# Patient Record
Sex: Male | Born: 1965 | Race: White | Hispanic: No | Marital: Married | Smoking: Former smoker
Health system: Southern US, Community
[De-identification: ages and names within clinical notes are randomized; demographics above are authoritative.]

## PROBLEM LIST (undated history)

## (undated) HISTORY — PX: KNEE SURGERY: SHX244

## (undated) HISTORY — PX: SHOULDER SURGERY: SHX246

---

## 2013-08-11 ENCOUNTER — Ambulatory Visit: Payer: Self-pay

## 2013-08-18 ENCOUNTER — Ambulatory Visit: Payer: Self-pay

## 2013-08-25 ENCOUNTER — Ambulatory Visit: Payer: Self-pay

## 2013-10-12 ENCOUNTER — Encounter (HOSPITAL_COMMUNITY): Payer: Self-pay | Admitting: Emergency Medicine

## 2013-10-12 ENCOUNTER — Emergency Department (HOSPITAL_COMMUNITY)
Admission: EM | Admit: 2013-10-12 | Discharge: 2013-10-12 | Disposition: A | Discharge status: Home / Self Care | Payer: 59 | Attending: Emergency Medicine | Admitting: Emergency Medicine

## 2013-10-12 ENCOUNTER — Emergency Department (HOSPITAL_COMMUNITY): Payer: 59

## 2013-10-12 DIAGNOSIS — Z79899 Other long term (current) drug therapy: Secondary | ICD-10-CM | POA: Insufficient documentation

## 2013-10-12 DIAGNOSIS — N201 Calculus of ureter: Secondary | ICD-10-CM | POA: Insufficient documentation

## 2013-10-12 DIAGNOSIS — R112 Nausea with vomiting, unspecified: Secondary | ICD-10-CM | POA: Insufficient documentation

## 2013-10-12 DIAGNOSIS — Z87891 Personal history of nicotine dependence: Secondary | ICD-10-CM | POA: Insufficient documentation

## 2013-10-12 LAB — URINALYSIS, ROUTINE W REFLEX MICROSCOPIC
Bilirubin Urine: NEGATIVE
Glucose, UA: NEGATIVE mg/dL
KETONES UR: NEGATIVE mg/dL
Leukocytes, UA: NEGATIVE
Nitrite: NEGATIVE
PROTEIN: NEGATIVE mg/dL
Specific Gravity, Urine: 1.028 (ref 1.005–1.030)
UROBILINOGEN UA: 0.2 mg/dL (ref 0.0–1.0)
pH: 5 (ref 5.0–8.0)

## 2013-10-12 LAB — CBC WITH DIFFERENTIAL/PLATELET
Basophils Absolute: 0 10*3/uL (ref 0.0–0.1)
Basophils Relative: 0 % (ref 0–1)
EOS PCT: 0 % (ref 0–5)
Eosinophils Absolute: 0 10*3/uL (ref 0.0–0.7)
HEMATOCRIT: 40.4 % (ref 39.0–52.0)
HEMOGLOBIN: 14.3 g/dL (ref 13.0–17.0)
Lymphocytes Relative: 10 % — ABNORMAL LOW (ref 12–46)
Lymphs Abs: 0.9 10*3/uL (ref 0.7–4.0)
MCH: 31.5 pg (ref 26.0–34.0)
MCHC: 35.4 g/dL (ref 30.0–36.0)
MCV: 89 fL (ref 78.0–100.0)
Monocytes Absolute: 0.5 10*3/uL (ref 0.1–1.0)
Monocytes Relative: 6 % (ref 3–12)
Neutro Abs: 7.7 10*3/uL (ref 1.7–7.7)
Neutrophils Relative %: 84 % — ABNORMAL HIGH (ref 43–77)
Platelets: 140 10*3/uL — ABNORMAL LOW (ref 150–400)
RBC: 4.54 MIL/uL (ref 4.22–5.81)
RDW: 12.9 % (ref 11.5–15.5)
WBC: 9.2 10*3/uL (ref 4.0–10.5)

## 2013-10-12 LAB — URINE MICROSCOPIC-ADD ON

## 2013-10-12 LAB — COMPREHENSIVE METABOLIC PANEL
ALT: 27 U/L (ref 0–53)
AST: 19 U/L (ref 0–37)
Albumin: 3.7 g/dL (ref 3.5–5.2)
Alkaline Phosphatase: 80 U/L (ref 39–117)
BILIRUBIN TOTAL: 0.4 mg/dL (ref 0.3–1.2)
BUN: 21 mg/dL (ref 6–23)
CALCIUM: 8.9 mg/dL (ref 8.4–10.5)
CHLORIDE: 101 meq/L (ref 96–112)
CO2: 20 meq/L (ref 19–32)
Creatinine, Ser: 1.19 mg/dL (ref 0.50–1.35)
GFR calc Af Amer: 81 mL/min — ABNORMAL LOW (ref >90)
GFR, EST NON AFRICAN AMERICAN: 70 mL/min — AB (ref >90)
Glucose, Bld: 167 mg/dL — ABNORMAL HIGH (ref 70–99)
Potassium: 3.7 mEq/L (ref 3.7–5.3)
SODIUM: 136 meq/L — AB (ref 137–147)
Total Protein: 7 g/dL (ref 6.0–8.3)

## 2013-10-12 LAB — LIPASE, BLOOD: Lipase: 40 U/L (ref 11–59)

## 2013-10-12 MED ORDER — HYDROCODONE-ACETAMINOPHEN 5-325 MG PO TABS: 1.0000 | ORAL_TABLET | ORAL | Status: AC | PRN

## 2013-10-12 MED ORDER — KETOROLAC TROMETHAMINE 30 MG/ML IJ SOLN
30.0000 mg | Freq: Once | INTRAMUSCULAR | Status: AC
  Administered 2013-10-12: 30 mg via INTRAVENOUS
  Filled 2013-10-12: qty 1

## 2013-10-12 MED ORDER — HYDROMORPHONE HCL PF 1 MG/ML IJ SOLN
1.0000 mg | Freq: Once | INTRAMUSCULAR | Status: AC
  Administered 2013-10-12: 1 mg via INTRAVENOUS
  Filled 2013-10-12: qty 1

## 2013-10-12 MED ORDER — ONDANSETRON HCL 4 MG PO TABS: 4.0000 mg | ORAL_TABLET | Freq: Four times a day (QID) | ORAL | Status: AC

## 2013-10-12 MED ORDER — TAMSULOSIN HCL 0.4 MG PO CAPS: 0.4000 mg | ORAL_CAPSULE | Freq: Every day | ORAL | Status: AC

## 2013-10-12 MED ORDER — ONDANSETRON HCL 4 MG/2ML IJ SOLN
4.0000 mg | Freq: Once | INTRAMUSCULAR | Status: AC
  Administered 2013-10-12: 4 mg via INTRAVENOUS
  Filled 2013-10-12: qty 2

## 2013-10-12 NOTE — ED Notes (Signed)
Pt escorted to discharge window. Pt verbalized understanding discharge instructions. In no acute distress.  

## 2013-10-12 NOTE — ED Provider Notes (Signed)
Medical screening examination/treatment/procedure(s) were performed by non-physician practitioner and as supervising physician I was immediately available for consultation/collaboration.    Bryan Mackielga [name], MD 10/12/13 [phone]

## 2013-10-12 NOTE — ED Notes (Signed)
Pt alert and oriented x4. Respirations even and unlabored, bilateral symmetrical rise and fall of chest. Skin warm and dry. In no acute distress. Denies needs.   

## 2013-10-12 NOTE — ED Provider Notes (Signed)
CSN: [id number]     Arrival date & time 10/12/13  0559 History   First MD Initiated Contact with Patient 10/12/13 [phone]     Chief Complaint  Patient presents with  . Flank Pain   (Consider location/radiation/quality/duration/timing/severity/associated sxs/prior Treatment) HPI Patient reports periumbilical and lower abdominal pain with small amount of radiation to his back that began abuptly at 3:30am, waking him from sleep.  He thought he might be constipated and took a laxative and then pepto bismol without any relief.  Has had a kidney stone before that felt somewhat similar but was more in his back than this.  Has vomited once, contents of his stomach.  Denies fevers, diarrhea, urinary symptoms, testicular pain or swelling.  No hx abdominal surgeries. Has never seen a urologist.   History reviewed. No pertinent past medical history. History reviewed. No pertinent past surgical history. History reviewed. No pertinent family history. History  Substance Use Topics  . Smoking status: Former Games developermoker  . Smokeless tobacco: Not on file  . Alcohol Use: Yes    Review of Systems  Constitutional: Negative for fever.  Respiratory: Negative for cough and shortness of breath.   Cardiovascular: Negative for chest pain.  Gastrointestinal: Positive for nausea, vomiting and abdominal pain. Negative for diarrhea.  Genitourinary: Negative for dysuria, urgency and frequency.  All other systems reviewed and are negative.    Allergies  Review of patient's allergies indicates no known allergies.  Home Medications   Current Outpatient Rx  Name  Route  Sig  Dispense  Refill  . acetaminophen (TYLENOL) 500 MG tablet   Oral   Take 500-1,000 mg by mouth every 6 (six) hours as needed for mild pain or headache.         . bisacodyl (DULCOLAX) 5 MG EC tablet   Oral   Take 5 mg by mouth daily as needed for moderate constipation.         Marland Kitchen. HYDROcodone-acetaminophen (NORCO/VICODIN) 5-325 MG per tablet    Oral   Take 1 tablet by mouth every 6 (six) hours as needed for moderate pain.         Marland Kitchen. HYDROcodone-acetaminophen (NORCO/VICODIN) 5-325 MG per tablet   Oral   Take 1 tablet by mouth every 4 (four) hours as needed.   15 tablet   0   . ondansetron (ZOFRAN) 4 MG tablet   Oral   Take 1 tablet (4 mg total) by mouth every 6 (six) hours.   12 tablet   0   . tamsulosin (FLOMAX) 0.4 MG CAPS capsule   Oral   Take 1 capsule (0.4 mg total) by mouth daily.   30 capsule   0    BP 165/90  Pulse 65  Temp(Src) 97.9 F (36.6 C) (Other (Comment))  Resp 24  SpO2 100% Physical Exam  Nursing note and vitals reviewed. Constitutional: He appears well-developed and well-nourished. No distress.  HENT:  Head: Normocephalic and atraumatic.  Neck: Neck supple.  Cardiovascular: Normal rate and regular rhythm.   Pulmonary/Chest: Effort normal and breath sounds normal. No respiratory distress. He has no wheezes. He has no rales.  Abdominal: Soft. He exhibits no distension and no mass. There is generalized tenderness. There is no rebound and no guarding.  Neurological: He is alert. He exhibits normal muscle tone.  Skin: He is not diaphoretic.    ED Course  Procedures (including critical care time) Labs Review Labs Reviewed  CBC WITH DIFFERENTIAL - Abnormal; Notable for the following:  Platelets 140 (*)    Neutrophils Relative % 84 (*)    Lymphocytes Relative 10 (*)    All other components within normal limits  COMPREHENSIVE METABOLIC PANEL - Abnormal; Notable for the following:    Sodium 136 (*)    Glucose, Bld 167 (*)    GFR calc non Af Amer 70 (*)    GFR calc Af Amer 81 (*)    All other components within normal limits  URINALYSIS, ROUTINE W REFLEX MICROSCOPIC - Abnormal; Notable for the following:    Hgb urine dipstick SMALL (*)    All other components within normal limits  URINE CULTURE  LIPASE, BLOOD  URINE MICROSCOPIC-ADD ON   Imaging Review Ct Abdomen Pelvis Wo  Contrast  10/12/2013   CLINICAL DATA:  Low back pain. Pain radiating into the penis. Flank pain.  EXAM: CT ABDOMEN AND PELVIS WITHOUT CONTRAST  TECHNIQUE: Multidetector CT imaging of the abdomen and pelvis was performed following the standard protocol without intravenous contrast.  COMPARISON:  None.  FINDINGS: Lung Bases: Dependent atelectasis.  Liver: Unenhanced CT was performed per clinician order. Lack of IV contrast limits sensitivity and specificity, especially for evaluation of abdominal/pelvic solid viscera. Grossly normal.  Spleen:  Normal.  Gallbladder:  Normal.  Common bile duct:  Normal.  Pancreas:  Normal.  Adrenal glands:  Normal.  Kidneys: Left kidney appears normal. No calculi. The left ureter is normal. There is mild right hydroureteronephrosis extending into the anatomic pelvis. At the right UVJ, there is a 2 mm calculus in the intramural portion of the distal right ureter. There is a punctate right upper pole residual collecting system calculus.  Stomach:  Grossly normal.  Small bowel:  Grossly normal.  Colon: Normal appendix. Remainder of the colon is within normal limits aside from colonic diverticulosis. No diverticulitis.  Pelvic Genitourinary:  Collapsed urinary bladder.  IMPRESSION:  1. Mild right hydroureteronephrosis with 2 mm distal right UVJ stone in the intramural right ureter. 2. Punctate nonobstructing residual collecting system calculus in the right kidney.   Electronically Signed   By: Andreas Newport M.D.   On: 10/12/2013 06:55    EKG Interpretation   None      7:54 AM Pt reports he thinks he passed the stone and is feeling much better, pain free currently.    MDM   1. Right ureteral stone    Pt with abrupt onset of abdominal pain, N/V, found to have right ureteral stone at UVJ - Likely passed while in ED as patient became completely pain free.  Labs unremarkable.  No signs of UTI.  D/C home with norco, zofran, flomax.  Urology follow up.  Urine culture pending.   Discussed result, findings, treatment, and follow up  with patient.  Pt given return precautions.  Pt verbalizes understanding and agrees with plan.        [place], PA-C 10/12/13 [phone]

## 2013-10-12 NOTE — Discharge Instructions (Signed)
Read the information below.  Use the prescribed medication as directed.  Please discuss all new medications with your pharmacist.  Do not take additional tylenol while taking the prescribed pain medication to avoid overdose.  You may return to the Emergency Department at any time for worsening condition or any new symptoms that concern you.  If you develop high fevers, worsening abdominal pain, uncontrolled vomiting, or are unable to tolerate fluids by mouth, return to the ER for a recheck.     Diet for Kidney Stones Kidney stones are small, hard masses that form inside your kidneys. They are made up of salts and minerals and often form when high levels build up in the urine. The minerals can then start to build up, crystalize, and stick together to form stones. There are several different types of kidney stones. The following types of stones may be influenced by dietary factors:   Calcium Oxalate Stones. An oxalate is a salt found in certain foods. Within the body, calcium can combine with oxalates to form calcium oxalate stones, which can be excreted in the urine in high amounts. This is the most common type of kidney stone.  Calcium Phosphate Stones. These stones may occur when the pH of the urine becomes too high, or less acidic, from too much calcium being excreted in the urine. The pH is a measure of how acidic or basic a substance is.  Uric Acid Stones. This type of stone occurs when the pH of the urine becomes too low, or very acidic, because substances called purines build up in the urine. Purines are found in animal proteins. When the urine is highly concentrated with acid, uric acid kidney stones can form.  Other risk factors for kidney stones include genetics, environment, and being overweight. Your caregiver may ask you to follow specific diet guidelines based on the type of stone you have to lessen the chances of your body making more kidney stones.  GENERAL GUIDELINES FOR ALL TYPES OF  STONES  Drink plenty of fluid. Drink 12 16 cups of fluid a day, drinking mainly water.This is the most important thing you can do to prevent the formation of future kidney stones.  Maintain a healthy weight. Your caregiver or dietitian can help you determine what a healthy weight is for you. If you are overweight, weight loss may help prevent the formation of future kidney stones.  Eat a diet adequate in animal protein. Too much animal protein can contribute to the formation of stones. Your dietitian can help you determine how much protein you should be eating. Avoid low carbohydrate, high protein diets.  Follow a balanced eating approach. The DASH diet, which stands for "Dietary Approaches to Stop Hypertension," is an effective meal plan for reducing stone formation. This diet is high in fruits, vegetables, dairy, and whole grains and low in animal protein. Ask your caregiver or dietitian for information about the DASH diet. ADDITIONAL DIET GUIDELINES FOR CALCIUM STONES Avoid foods high in salt. This includes table salt, salt seasonings, MSG, soy sauce, cured and processed meats, salted crackers and snack foods, fast food, and canned soups and foods. Ask your caregiver or dietitian for information about reducing sodium in your diet or following the low sodium diet.  Ensure adequate calcium intake. Use the following table for calcium guidelines:  Men 43 years old and younger  1000 mg/day.  Men 62 years old and older  1500 mg/day.  Women 30 50 years old  1000 mg/day.  Women 50  years and older  1500 mg/day. Your dietitian can help you determine if you are getting enough calcium in your diet. Foods that are high in calcium include dairy products, broccoli, cheese, yogurt, and pudding. If you need to take a calcium supplement, take it only in the form of calcium citrate.  Avoid foods high in oxalate. Be sure that any supplements you take do not contain more than 500 mg of vitamin C. Vitamin C is  converted into oxalate in the body. You do not need to avoid fruits and vegetables high in vitamin C.   Grains: High-fiber or bran cereal, whole-wheat bread, grits, barley, buckwheat, amaranth, pretzels, and fruitcake.  Vegetables: Dried beans, wax beans, dark leafy greens, eggplant, leeks, okra, parsley, rutabaga, tomato paste, watercress, zucchini, and escarole.  Fruit: Dried apricots, red currants, figs, kiwi, and rhubarb.  Meat and Meat Substitutes: Soybeans and foods made from soy (soyburger, miso), dried beans, peanut butter.  Milk: Chocolate milk mixes and soymilk.  Fats and Oils: Nuts (peanuts, almonds, pecans, cashews, hazelnuts) and nut butters, sesame seeds, and tDahini paste.  Condiments/Miscellaneous: Chocolate, carob, marmalade, poppy seeds, instant iced tea, and juice from high-oxalate fruits.  Document Released: 12/15/2010 Document Revised: 02/19/2012 Document Reviewed: 02/04/2012 [facility] Patient Information 2014 [place], [place].  Ureteral Colic (Kidney Stones) Ureteral colic is the result of a condition when kidney stones form inside the kidney. Once kidney stones are formed they may move into the tube that connects the kidney with the bladder (ureter). If this occurs, this condition may cause pain (colic) in the ureter.  CAUSES  Pain is caused by stone movement in the ureter and the obstruction caused by the stone. SYMPTOMS  The pain comes and goes as the ureter contracts around the stone. The pain is usually intense, sharp, and stabbing in character. The location of the pain may move as the stone moves through the ureter. When the stone is near the kidney the pain is usually located in the back and radiates to the belly (abdomen). When the stone is ready to pass into the bladder the pain is often located in the lower abdomen on the side the stone is located. At this location, the symptoms may mimic those of a urinary tract infection with urinary frequency. Once the stone  is located here it often passes into the bladder and the pain disappears completely. TREATMENT   Your caregiver will provide you with medicine for pain relief.  You may require specialized follow-up X-rays.  The absence of pain does not always mean that the stone has passed. It may have just stopped moving. If the urine remains completely obstructed, it can cause loss of kidney function or even complete destruction of the involved kidney. It is your responsibility and in your interest that X-rays and follow-ups as suggested by your caregiver are completed. Relief of pain without passage of the stone can be associated with severe damage to the kidney, including loss of kidney function on that side.  If your stone does not pass on its own, additional measures may be taken by your caregiver to ensure its removal. HOME CARE INSTRUCTIONS   Increase your fluid intake. Water is the preferred fluid since juices containing vitamin C may acidify the urine making it less likely for certain stones (uric acid stones) to pass.  Strain all urine. A strainer will be provided. Keep all particulate matter or stones for your caregiver to inspect.  Take your pain medicine as directed.  Make a follow-up appointment  with your caregiver as directed.  Remember that the goal is passage of your stone. The absence of pain does not mean the stone is gone. Follow your caregiver's instructions.  Only take over-the-counter or prescription medicines for pain, discomfort, or fever as directed by your caregiver. SEEK MEDICAL CARE IF:   Pain cannot be controlled with the prescribed medicine.  You have a fever.  Pain continues for longer than your caregiver advises it should.  There is a change in the pain, and you develop chest discomfort or constant abdominal pain.  You feel faint or pass out. MAKE SURE YOU:   Understand these instructions.  Will watch your condition.  Will get help right away if you are not  doing well or get worse. Document Released: 05/30/2005 Document Revised: 12/15/2012 Document Reviewed: 02/14/2011 [facility] Patient Information 2014 [place], [place].

## 2013-10-12 NOTE — ED Notes (Signed)
Pt arrived to the ED with a complaint of abdominal pain.  Pt states she was awoke from sleep two hours from this writing with lower abdominal pain.  Pt states he is also having pain in  The head of his penis.

## 2013-10-13 ENCOUNTER — Telehealth (HOSPITAL_COMMUNITY): Payer: Self-pay

## 2013-10-13 LAB — URINE CULTURE
COLONY COUNT: NO GROWTH
CULTURE: NO GROWTH

## 2014-12-24 IMAGING — CT CT ABD-PELV W/O CM
1 series · 14 of 22 positions shown, 19 images · non-contrast
Comparison: None.

CLINICAL DATA: Low back pain. Pain radiating into the penis. Flank
pain.

EXAM:
CT ABDOMEN AND PELVIS WITHOUT CONTRAST
TECHNIQUE: Multidetector CT imaging of the abdomen and pelvis was performed
following the standard protocol without intravenous contrast.

[Series 3: lung · axial · 0.74mm/px · z∈[+1618,+1712]mm · 14 of 22 slices shown, 19 images]
[im 2/22  soft-tissue]
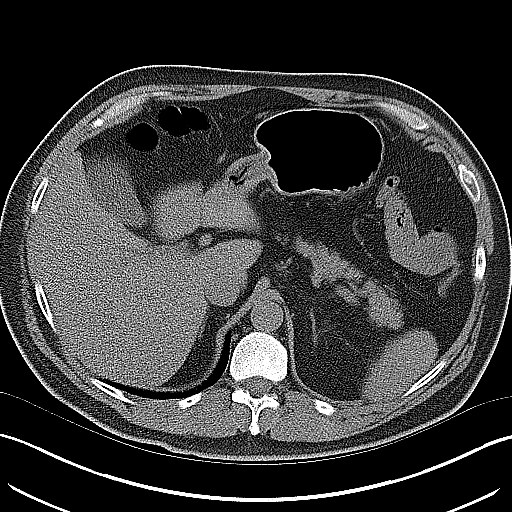
[im 2/22  bone]
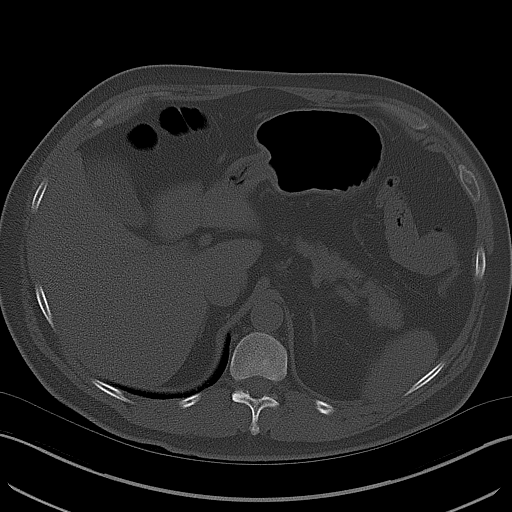
[im 4/22  soft-tissue]
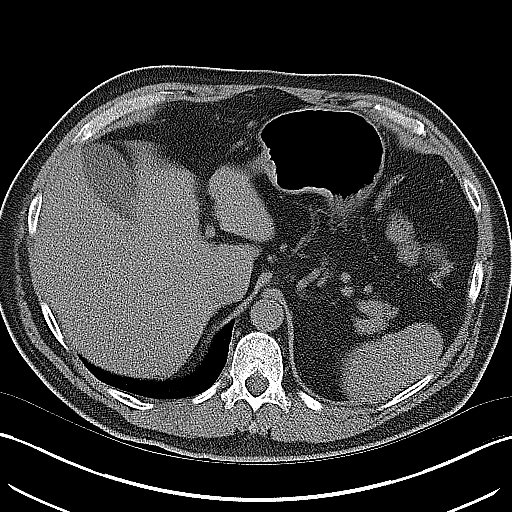
[im 5/22  soft-tissue]
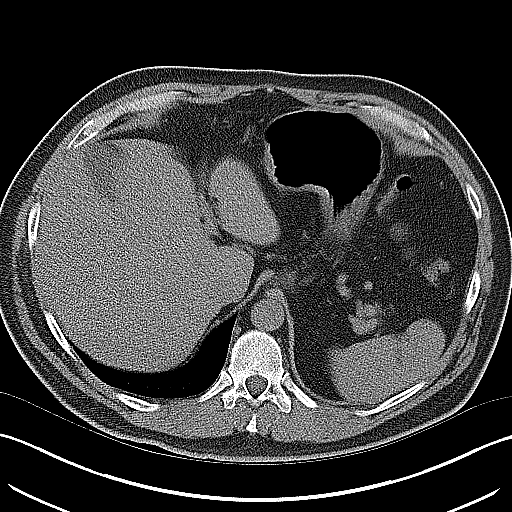
[im 7/22  soft-tissue]
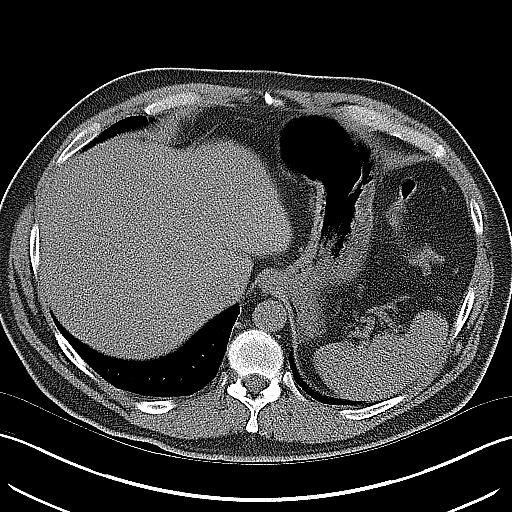
[im 8/22  soft-tissue]
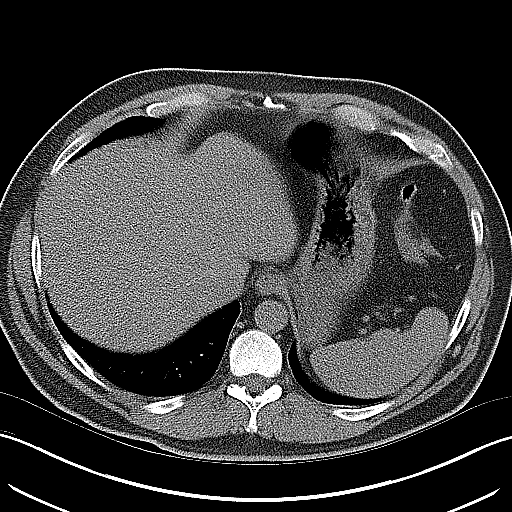
[im 10/22  soft-tissue]
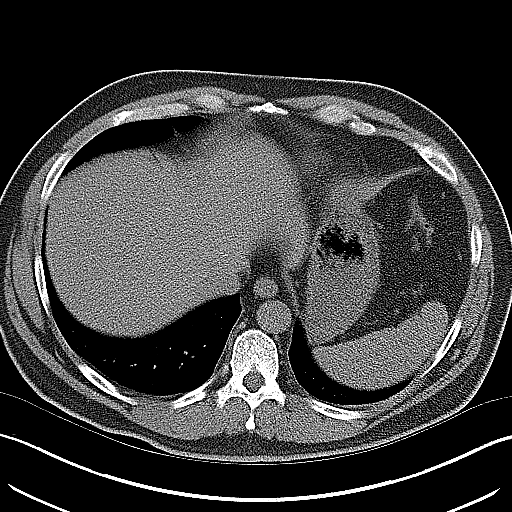
[im 12/22  soft-tissue]
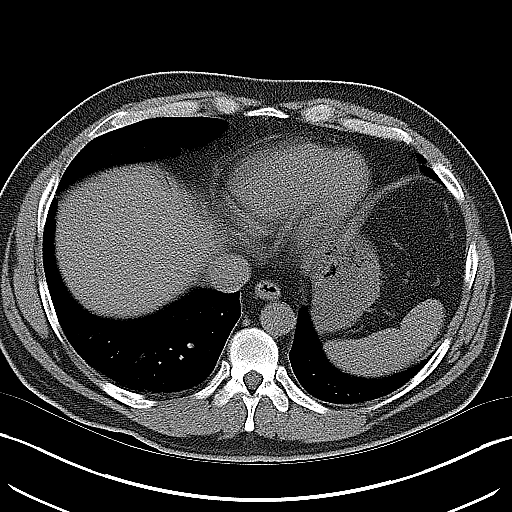
[im 13/22  soft-tissue]
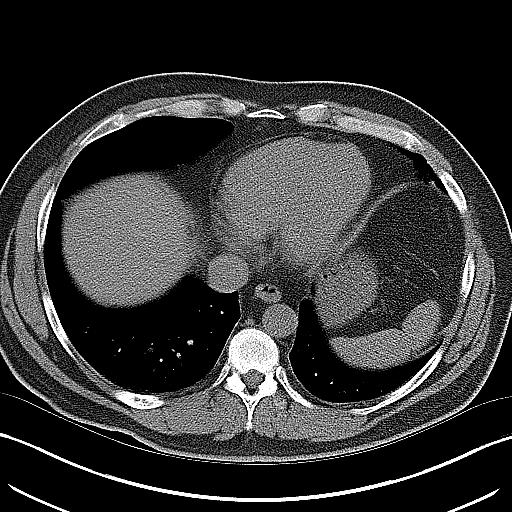
[im 15/22  soft-tissue]
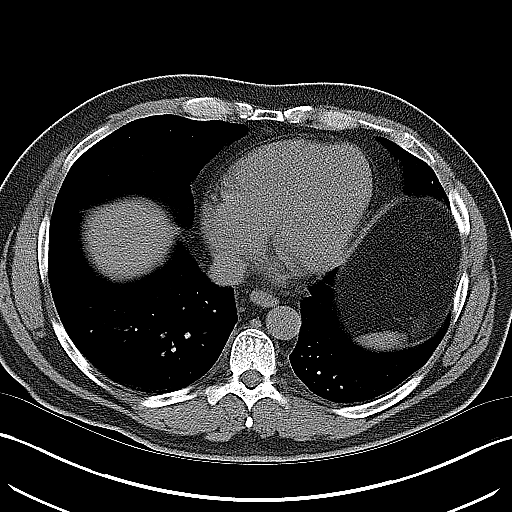
[im 15/22  bone]
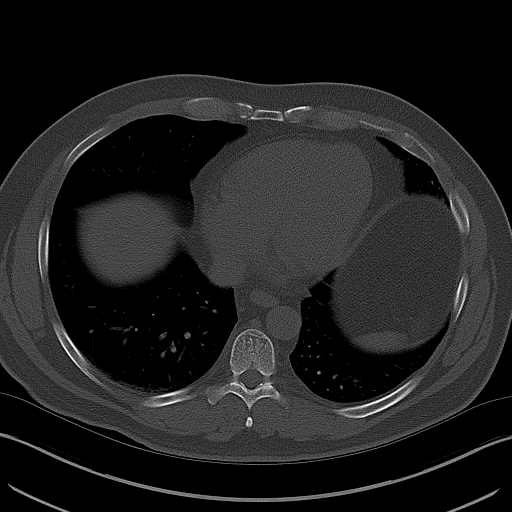
[im 16/22  soft-tissue]
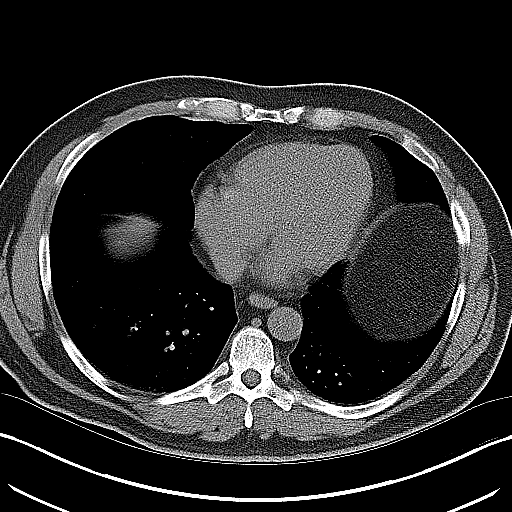
[im 18/22  soft-tissue]
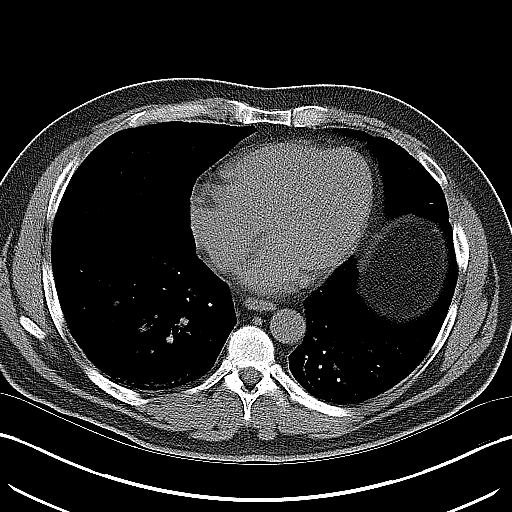
[im 18/22  lung]
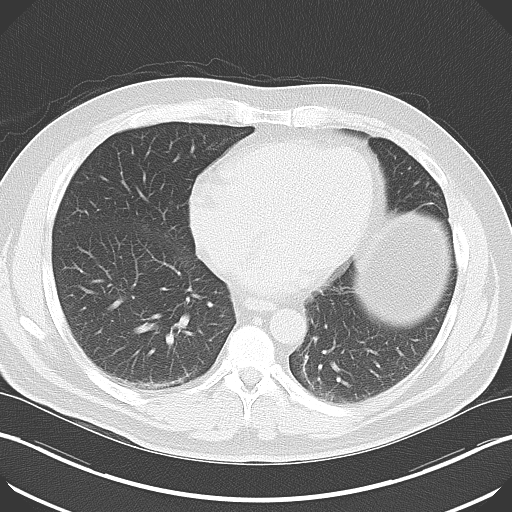
[im 19/22  soft-tissue]
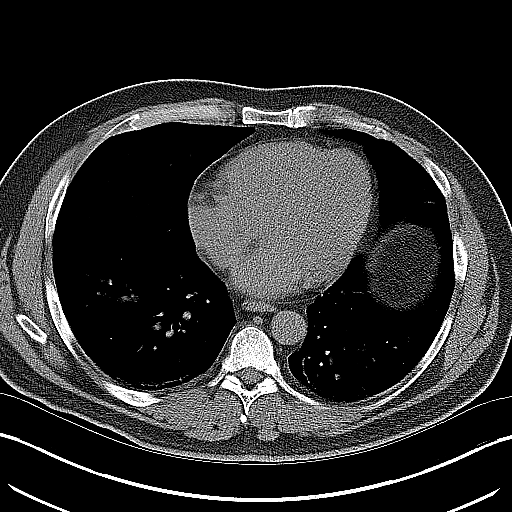
[im 19/22  lung]
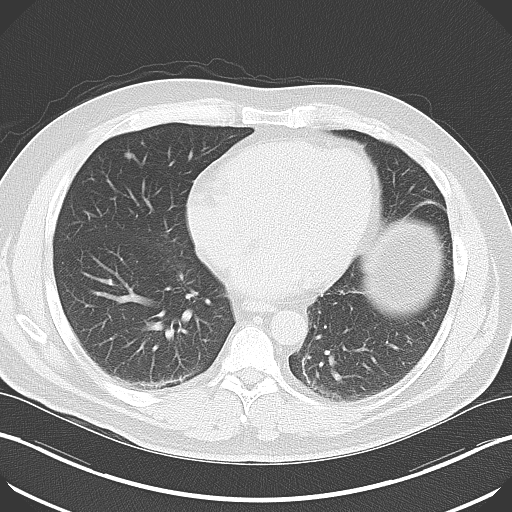
[im 20/22  lung]
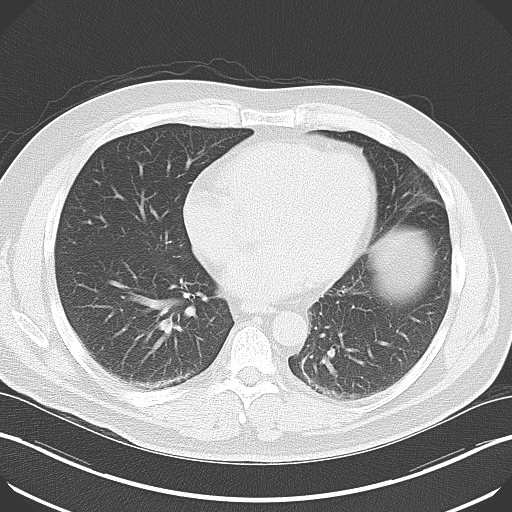
[im 21/22  soft-tissue]
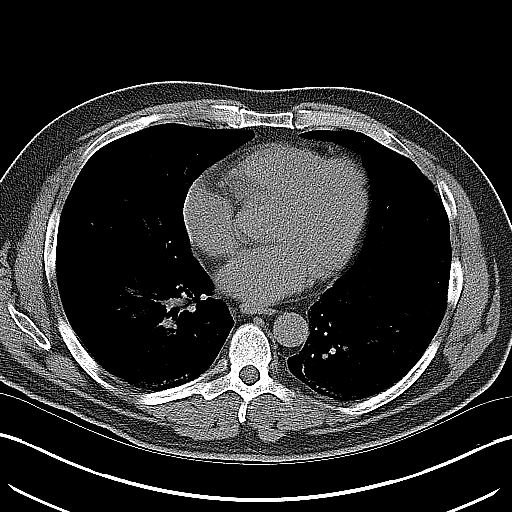
[im 21/22  lung]
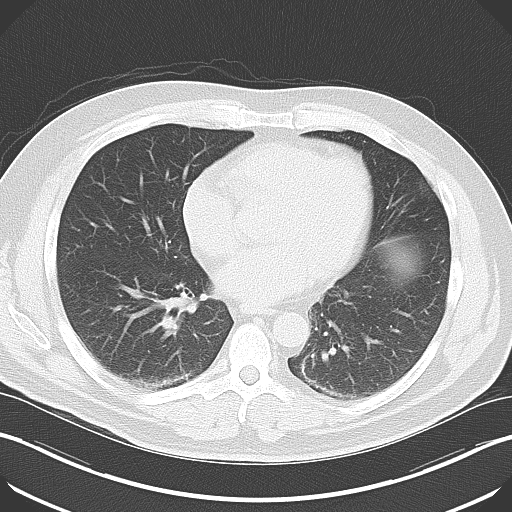

[14 of 22 positions shown; findings below may reference images not displayed]

FINDINGS: Lung Bases: Dependent atelectasis.

Liver: Unenhanced CT was performed per clinician order. Lack of IV
contrast limits sensitivity and specificity, especially for
evaluation of abdominal/pelvic solid viscera. Grossly normal.

Spleen:  Normal.

Gallbladder:  Normal.

Common bile duct:  Normal.

Pancreas:  Normal.

Adrenal glands:  Normal.

Kidneys: Left kidney appears normal. No calculi. The left ureter is
normal. There is mild right hydroureteronephrosis extending into the
anatomic pelvis. At the right UVJ, there is a 2 mm calculus in the
intramural portion of the distal right ureter. There is a punctate
right upper pole residual collecting system calculus.

Stomach:  Grossly normal.

Small bowel:  Grossly normal.

Colon: Normal appendix. Remainder of the colon is within normal
limits aside from colonic diverticulosis. No diverticulitis.

Pelvic Genitourinary:  Collapsed urinary bladder.
IMPRESSION: 1. Mild right hydroureteronephrosis with 2 mm distal right UVJ stone
in the intramural right ureter.
2. Punctate nonobstructing residual collecting system calculus in
the right kidney.

## 2018-06-12 ENCOUNTER — Ambulatory Visit (INDEPENDENT_AMBULATORY_CARE_PROVIDER_SITE_OTHER): Payer: 59 | Admitting: Psychiatry

## 2018-06-12 DIAGNOSIS — F321 Major depressive disorder, single episode, moderate: Secondary | ICD-10-CM
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
## 2018-07-07 ENCOUNTER — Encounter

## 2018-07-07 ENCOUNTER — Ambulatory Visit: Payer: 59 | Admitting: Psychiatry

## 2018-07-08 ENCOUNTER — Ambulatory Visit (INDEPENDENT_AMBULATORY_CARE_PROVIDER_SITE_OTHER): Payer: 59 | Admitting: Psychiatry

## 2018-07-08 DIAGNOSIS — F321 Major depressive disorder, single episode, moderate: Secondary | ICD-10-CM
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
## 2018-07-21 ENCOUNTER — Encounter: Payer: Self-pay | Admitting: Psychiatry

## 2018-07-21 ENCOUNTER — Ambulatory Visit (INDEPENDENT_AMBULATORY_CARE_PROVIDER_SITE_OTHER): Payer: 59 | Admitting: Psychiatry

## 2018-07-21 DIAGNOSIS — F321 Major depressive disorder, single episode, moderate: Secondary | ICD-10-CM

## 2018-07-21 NOTE — Progress Notes (Signed)
      [facility] Counselor/Therapist Progress Note   Patient ID: Solon AugustaJonathan B Fiddler Jr., MRN: [id number]  Date: 07/21/2018  Timespent: 52 minutes   Treatment Type: Individual   Reported Symptoms: anxiety   Mental Status Exam:    Appearance:   Casual     Behavior:  Appropriate  Motor:  Normal  Speech/Language:   Clear and Coherent  Affect:  Appropriate  Mood:  anxious  Thought process:  normal  Thought content:    WNL  Sensory/Perceptual disturbances:    WNL  Orientation:  oriented to person, place, time/date and situation  Attention:  Good  Concentration:  Good  Memory:  WNL  Fund of knowledge:   Good  Insight:    Good  Judgment:   Good  Impulse Control:  Good     Risk Assessment: Danger to Self:  No Self-injurious Behavior: No Danger to Others: No Duty to Warn:no Physical Aggression / Violence:No  Access to Firearms a concern: No  Gang Involvement:No    Subjective: The client states, "I wasted so much of my life getting high and watching TV.  I have been to passive watching wife passed me by." The client discussed today that he really wants to end his marriage but he is held on for 30 years because of his children.  His youngest son is now a senior in high school and he plans after the first of the year to talk with his wife about their separation and eventual divorce.  He notes that he is worried that in the past that she would leave him but now he realizes he needs to move on.  Today we used eye movement desensitization and reprocessing to focus on his reluctance to divorce.  He feels like his loyalty to the marriage and keeping his family secure her major reasons of why he fears and dreads divorce.  He says, "I do not want to hurt my wife."  He also understands that the uncertainty of a new life is anxiety provoking.  He knows he needs to work on transportation finding a place to live finances and his health.  The client will begin to address these and make a plan  going forward.   Interventions: Solution-Oriented/Positive Psychology, Eye Movement Desensitization and Reprocessing (EMDR) and Insight-Oriented   Diagnosis:   ICD-10-CM   1. Major depressive disorder, single episode, moderate (HCC) F32.1      Plan: Make a plan, find work.   Gelene MinkFrederick Fartun [place], [place]

## 2018-08-07 ENCOUNTER — Ambulatory Visit (INDEPENDENT_AMBULATORY_CARE_PROVIDER_SITE_OTHER): Payer: 59 | Admitting: Psychiatry

## 2018-08-07 ENCOUNTER — Encounter: Payer: Self-pay | Admitting: Psychiatry

## 2018-08-07 DIAGNOSIS — F321 Major depressive disorder, single episode, moderate: Secondary | ICD-10-CM | POA: Diagnosis not present

## 2018-08-07 NOTE — Progress Notes (Signed)
      [facility] Counselor/Therapist Progress Note  Patient ID: Bryan AugustaJonathan B Larmer Jr., MRN: [id number],    Date: 08/07/2018   Time Spent: 56 minutes  Treatment Type: Individual Therapy  Reported Symptoms: Anxious Mood and Fatigue  Mental Status Exam:  Appearance:   Casual and Well Groomed     Behavior:  Appropriate  Motor:  Normal  Speech/Language:   Clear and Coherent  Affect:  Appropriate  Mood:  anxious and sad  Thought process:  normal  Thought content:    WNL  Sensory/Perceptual disturbances:    WNL  Orientation:  oriented to person, place, time/date and situation  Attention:  Good  Concentration:  Good  Memory:  WNL  Fund of knowledge:   Good  Insight:    Good  Judgment:   Good  Impulse Control:  Good   Risk Assessment: Danger to Self:  No Self-injurious Behavior: No Danger to Others: No Duty to Warn:no Physical Aggression / Violence:No  Access to Firearms a concern: No  Gang Involvement:No   Subjective: The client reports that he Bryan Wood have an income opportunity.  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  Working a few days a week he is able to make $30,000.  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  This helps with the client's goal of becoming more financially secure as he prepares to ask his wife for a divorce.  Today the client announced that he was going to stop smoking pot.  I used eye movement desensitization and reprocessing with the client.  His negative cognition was, "part of me is dying."  He felt nervous in his shoulders.  His subjective units of distress was a 6+.  As the client processed this target he began to set boundaries for himself.  #1 "I cannot do it before dark.  #2 I need to start pushing back the time later and later.  Even though the marijuana physically relaxes him he  states it also gives him an excuse to be lazy.  "It is a way I can avoid responsibility.  I am a big child."  The client noted that he had a lot of fear of failure and self-doubt from past mistakes.  As he continued to process this he came to the conclusion, "I can do this."  His subjective units of distress equaled 1 at the end of session.  Interventions: Solution-Oriented/Positive Psychology, Eye Movement Desensitization and Reprocessing (EMDR) and Insight-Oriented  Diagnosis:   ICD-10-CM   1. Major depressive disorder, single episode, moderate (HCC) F32.1     Plan: Boundaries, stop pot.  [place], [place]

## 2018-08-14 ENCOUNTER — Ambulatory Visit (INDEPENDENT_AMBULATORY_CARE_PROVIDER_SITE_OTHER): Payer: 59 | Admitting: Psychiatry

## 2018-08-14 ENCOUNTER — Encounter: Payer: Self-pay | Admitting: Psychiatry

## 2018-08-14 DIAGNOSIS — F321 Major depressive disorder, single episode, moderate: Secondary | ICD-10-CM
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
## 2018-09-10 ENCOUNTER — Encounter: Payer: Self-pay | Admitting: Psychiatry

## 2018-09-10 ENCOUNTER — Ambulatory Visit (INDEPENDENT_AMBULATORY_CARE_PROVIDER_SITE_OTHER): Payer: BLUE CROSS/BLUE SHIELD | Admitting: Psychiatry

## 2018-09-10 DIAGNOSIS — F321 Major depressive disorder, single episode, moderate: Secondary | ICD-10-CM | POA: Diagnosis not present

## 2018-09-10 NOTE — Progress Notes (Signed)
      [facility] Counselor/Therapist Progress Note  Patient ID: Bryan Wood, Bryan Wood MRN: [id number],    Date: 09/10/2018  Time Spent: 52 minutes   Treatment Type: Individual Therapy  Reported Symptoms: Anxious Mood and Fatigue  Mental Status Exam:  Appearance:   Casual and Well Groomed     Behavior:  Appropriate  Motor:  Normal  Speech/Language:   Clear and Coherent  Affect:  Appropriate  Mood:  anxious and sad  Thought process:  normal  Thought content:    WNL  Sensory/Perceptual disturbances:    WNL  Orientation:  oriented to person, place, time/date and situation  Attention:  Good  Concentration:  Good  Memory:  WNL  Fund of knowledge:   Good  Insight:    Good  Judgment:   Good  Impulse Control:  Good   Risk Assessment: Danger to Self:  No Self-injurious Behavior: No Danger to Others: No Duty to Warn:no Physical Aggression / Violence:No  Access to Firearms a concern: No  Gang Involvement:No   Subjective: Client states that he has quit smoking marijuana about 80%.  "I smoke because I am bored.  I need to fill it with something else."  We discussed what other things he could begin to do to improve his life.  He does have a plan for exercise.  I asked him to consider at least 30 minutes every other day for cardio and resistance training on the other days.  He states, "I will have to work into that." His sister has asked him to travel to [place] with her in [place].  He is using that trip as motivation to get in shape.  We discussed the clients relationship with his wife.  He has anxiety about discussing the end of their relationship with her.  I used EMDR with the client today to help reduce his sense of guilt and responsibility at having this conversation.  As the client processed his subjective units of distress went from a 5 to less than 1 at the end of the session.  He was much more confident and felt much more relieved at the end.  He realized he needed to go ahead and  pull the trigger on this because to wait would cause him more angst.  Interventions: Assertiveness/Communication, Solution-Oriented/Positive Psychology, Eye Movement Desensitization and Reprocessing (EMDR) and Insight-Oriented  Diagnosis:   ICD-10-CM   1. Major depressive disorder, single episode, moderate (HCC) F32.1     Plan: Assertiveness, boundaries, exercise, self-care.  Bryan Wood, [place]

## 2018-09-15 DIAGNOSIS — F430 Acute stress reaction: Secondary | ICD-10-CM | POA: Diagnosis not present

## 2018-09-15 DIAGNOSIS — E119 Type 2 diabetes mellitus without complications: Secondary | ICD-10-CM | POA: Diagnosis not present

## 2018-09-15 DIAGNOSIS — R972 Elevated prostate specific antigen [PSA]: Secondary | ICD-10-CM | POA: Diagnosis not present

## 2018-09-15 DIAGNOSIS — N529 Male erectile dysfunction, unspecified: Secondary | ICD-10-CM | POA: Diagnosis not present

## 2018-09-24 ENCOUNTER — Encounter: Payer: Self-pay | Admitting: Psychiatry

## 2018-09-24 ENCOUNTER — Ambulatory Visit (INDEPENDENT_AMBULATORY_CARE_PROVIDER_SITE_OTHER): Payer: BLUE CROSS/BLUE SHIELD | Admitting: Psychiatry

## 2018-09-24 DIAGNOSIS — F321 Major depressive disorder, single episode, moderate: Secondary | ICD-10-CM | POA: Diagnosis not present
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
## 2018-10-08 ENCOUNTER — Ambulatory Visit: Payer: 59 | Admitting: Psychiatry

## 2018-10-27 ENCOUNTER — Encounter: Payer: Self-pay | Admitting: Psychiatry

## 2018-10-27 ENCOUNTER — Ambulatory Visit (INDEPENDENT_AMBULATORY_CARE_PROVIDER_SITE_OTHER): Payer: BLUE CROSS/BLUE SHIELD | Admitting: Psychiatry

## 2018-10-27 DIAGNOSIS — F321 Major depressive disorder, single episode, moderate: Secondary | ICD-10-CM | POA: Diagnosis not present

## 2018-10-27 NOTE — Progress Notes (Signed)
      [facility] Counselor/Therapist Progress Note  Patient ID: Hoyle, Montero MRN: [id number],    Date: 10/27/2018  Time Spent: 51 minutes   Treatment Type: Individual Therapy  Reported Symptoms: Anxiety, sadness, some lack of motivation.  Mental Status Exam:  Appearance:   Casual     Behavior:  Appropriate  Motor:  Normal  Speech/Language:   Clear and Coherent  Affect:  Appropriate  Mood:  anxious and sad  Thought process:  normal  Thought content:    WNL  Sensory/Perceptual disturbances:    WNL  Orientation:  oriented to person, place, time/date and situation  Attention:  Good  Concentration:  Good  Memory:  WNL  Fund of knowledge:   Good  Insight:    Good  Judgment:   Good  Impulse Control:  Good   Risk Assessment: Danger to Self:  No Self-injurious Behavior: No Danger to Others: No Duty to Warn:no Physical Aggression / Violence:No  Access to Firearms a concern: No  Gang Involvement:No   Subjective: The client attended his sons state wrestling match at the [facility] this past weekend.  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  He states the team placed second in the state in their division. He states, "I did talk with my wife the day after Valentine's Day.  We both admitted that we are miserable."  The client states that the conversation was amicable.  It was very sad for both of them.  They discussed splitting up assets but have not set a date to separate.  He admitted to his wife that he was still smoking marijuana and watching pornography.  He felt he had to be honest and thought it gave her a way out.  He knows that she cannot put up with either 1 of those things. Client spoke with his son who was okay with the break-up.  His wife had talked to their daughter and things went well there.  He will focus on gaining more of an income and saving enough money to be able to move out. The client will follow-up on an as-needed basis.  Interventions:  Assertiveness/Communication, Solution-Oriented/Positive Psychology and Insight-Oriented  Diagnosis:   ICD-10-CM   1. Major depressive disorder, single episode, moderate (HCC) F32.1     Plan: Self-care, positive self talk, exercise.  This record has been created using [employer].  Chart creation errors have been sought, but Kylie Gros not always have been located and corrected. Such creation errors do not reflect on the standard of medical care.  Anjuli Gemmill, [place]

## 2018-11-11 ENCOUNTER — Ambulatory Visit (INDEPENDENT_AMBULATORY_CARE_PROVIDER_SITE_OTHER): Payer: BLUE CROSS/BLUE SHIELD | Admitting: Psychiatry

## 2018-11-11 ENCOUNTER — Encounter: Payer: Self-pay | Admitting: Psychiatry

## 2018-11-11 DIAGNOSIS — F4323 Adjustment disorder with mixed anxiety and depressed mood: Secondary | ICD-10-CM
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
## 2018-11-25 ENCOUNTER — Ambulatory Visit: Payer: BLUE CROSS/BLUE SHIELD | Admitting: Psychiatry

## 2018-12-29 ENCOUNTER — Ambulatory Visit: Payer: BLUE CROSS/BLUE SHIELD | Admitting: Psychiatry

## 2018-12-29 ENCOUNTER — Other Ambulatory Visit: Payer: Self-pay

## 2019-01-15 ENCOUNTER — Other Ambulatory Visit: Payer: Self-pay

## 2019-01-15 ENCOUNTER — Encounter: Payer: Self-pay | Admitting: Psychiatry

## 2019-01-15 ENCOUNTER — Ambulatory Visit: Payer: BLUE CROSS/BLUE SHIELD | Admitting: Psychiatry

## 2019-01-15 DIAGNOSIS — F4323 Adjustment disorder with mixed anxiety and depressed mood: Secondary | ICD-10-CM | POA: Diagnosis not present

## 2019-01-15 NOTE — Progress Notes (Signed)
      [facility] Counselor/Therapist Progress Note  Patient ID: Bastien, Strawser MRN: [id number],    Date: 01/15/2019  Time Spent: 58 minutes   Treatment Type: Individual Therapy  Reported Symptoms: anxiety, sadness.  Mental Status Exam:  Appearance:   Casual and Well Groomed     Behavior:  Appropriate  Motor:  Normal  Speech/Language:   Clear and Coherent  Affect:  Appropriate  Mood:  anxious and sad  Thought process:  normal  Thought content:    WNL  Sensory/Perceptual disturbances:    WNL  Orientation:  oriented to person, place, time/date and situation  Attention:  Good  Concentration:  Good  Memory:  WNL  Fund of knowledge:   Good  Insight:    Good  Judgment:   Good  Impulse Control:  Good   Risk Assessment: Danger to Self:  No Self-injurious Behavior: No Danger to Others: No Duty to Warn:no Physical Aggression / Violence:No  Access to Firearms a concern: No  Gang Involvement:No   Subjective: I met with the client face-to-face today.  He and I both had masks.  He reports that things have gone well with his family during the pandemic.  He and his wife celebrated their 31st wedding anniversary.  Both he and his wife realize that they are not staying together but they have been very amicable. He has been working on building his [employer].  He has some anxiety about cold calling to develop new business.  He had planned on trying to do his dad's business but his dad decided to take it back over. He has been frustrated with his father that he is not followed any of the sheltering place guidelines.  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  He is concerned because his father has cancer, takes oral chemotherapy and has a suppressed immune system.  We discussed radical acceptance because he has no control over what his father can do.  The client agreed. I discussed with the client picking exercise back up.  He knows that he needs to but has been  avoiding it.  I told the client if he expect to see improvement he is got to take some positive steps towards recovery.  He agrees.  Interventions: Solution-Oriented/Positive Psychology and Insight-Oriented  Diagnosis:   ICD-10-CM   1. Adjustment disorder with mixed anxiety and depressed mood F43.23     Plan: Exercise, self-care, positive self talk, boundaries, assertive communication.  This record has been created using [employer].  Chart creation errors have been sought, but Courtenay Creger not always have been located and corrected. Such creation errors do not reflect on the standard of medical care.  Lavanya Roa, [facility]

## 2019-01-27 ENCOUNTER — Encounter: Payer: Self-pay | Admitting: Psychiatry

## 2019-01-27 ENCOUNTER — Ambulatory Visit (INDEPENDENT_AMBULATORY_CARE_PROVIDER_SITE_OTHER): Payer: BLUE CROSS/BLUE SHIELD | Admitting: Psychiatry

## 2019-01-27 ENCOUNTER — Other Ambulatory Visit: Payer: Self-pay

## 2019-01-27 DIAGNOSIS — F321 Major depressive disorder, single episode, moderate: Secondary | ICD-10-CM
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
## 2019-02-12 ENCOUNTER — Other Ambulatory Visit: Payer: Self-pay

## 2019-02-12 ENCOUNTER — Ambulatory Visit: Payer: BC Managed Care – PPO | Admitting: Psychiatry

## 2019-02-12 ENCOUNTER — Encounter: Payer: Self-pay | Admitting: Psychiatry

## 2019-02-12 DIAGNOSIS — F321 Major depressive disorder, single episode, moderate: Secondary | ICD-10-CM

## 2019-02-12 NOTE — Progress Notes (Signed)
      [facility] Counselor/Therapist Progress Note  Patient ID: Kinser, Fellman MRN: [id number],    Date: 02/12/2019  Time Spent: 56 minutes   Treatment Type: Individual Therapy  Reported Symptoms: depressed, irritable, lack of motivation  Mental Status Exam:  Appearance:   Casual     Behavior:  Appropriate  Motor:  Normal  Speech/Language:   Clear and Coherent  Affect:  Appropriate  Mood:  depressed, irritable and sad  Thought process:  normal  Thought content:    WNL  Sensory/Perceptual disturbances:    WNL  Orientation:  oriented to person, place, time/date and situation  Attention:  Good  Concentration:  Good  Memory:  WNL  Fund of knowledge:   Good  Insight:    Good  Judgment:   Good  Impulse Control:  Good   Risk Assessment: Danger to Self:  No Self-injurious Behavior: No Danger to Others: No Duty to Warn:no Physical Aggression / Violence:No  Access to Firearms a concern: No  Gang Involvement:No   Subjective: I met with the client face-to-face.  We both had facemasks. Client states that he did not read the article on "the broken brain."  He states he did talk to his daughter about his anger and made a commitment to her to take Facebook off his phone.  He notes that being on social media can agitate him to the point about rage.  I suggested to the client in general that he Katrianna Friesenhahn want to take a break from social media altogether.  He did not feel like he could do that but would start with just taking Facebook off his phone. The client has not started exercising but agrees that he needs to start.  I used eye-movement with the client to help process through some of his sadness that he was feeling today and lack of motivation.  As the client used eye-movement his mood did lighten from a subjective units of distress of 7 to less than 2 at the end of the session.  He was able to articulate what he could do to begin to exercise.  His wife and daughter are going out of town  this next week and his son will be spending time with his friends.  He states this will give him time by himself that he needs and he can begin his exercise.  He will be outside working in his yard.  I asked the client to consider working with his shirt off for about 1/2-hour just for the sun exposure.  I cautioned him against getting a sunburn.  He agreed. I asked the client to try to be mindful practicing deep breathing for 3 minutes each morning.  Client states he does not know if he will do that but he will try.  Interventions: Assertiveness/Communication, Mindfulness Meditation, Motivational Interviewing, Solution-Oriented/Positive Psychology, [facility] Desensitization and Reprocessing (EMDR) and Insight-Oriented  Diagnosis:   ICD-10-CM   1. Major depressive disorder, single episode, moderate (HCC)  F32.1     Plan: Exercise, sun exposure, self-care, take a break from social media.  This record has been created using [employer].  Chart creation errors have been sought, but Tyan Dy not always have been located and corrected. Such creation errors do not reflect on the standard of medical care.  Abdullahi Vallone, [facility]

## 2019-02-23 DIAGNOSIS — Z1159 Encounter for screening for other viral diseases: Secondary | ICD-10-CM | POA: Diagnosis not present

## 2019-02-23 DIAGNOSIS — Z0000 Encounter for general adult medical examination without abnormal findings: Secondary | ICD-10-CM | POA: Diagnosis not present

## 2019-02-23 DIAGNOSIS — Z125 Encounter for screening for malignant neoplasm of prostate: Secondary | ICD-10-CM | POA: Diagnosis not present

## 2019-02-23 DIAGNOSIS — E119 Type 2 diabetes mellitus without complications: Secondary | ICD-10-CM | POA: Diagnosis not present

## 2019-02-23 DIAGNOSIS — E782 Mixed hyperlipidemia: Secondary | ICD-10-CM | POA: Diagnosis not present

## 2019-02-23 DIAGNOSIS — F430 Acute stress reaction: Secondary | ICD-10-CM | POA: Diagnosis not present

## 2019-03-05 ENCOUNTER — Other Ambulatory Visit: Payer: Self-pay

## 2019-03-05 ENCOUNTER — Ambulatory Visit: Payer: BC Managed Care – PPO | Admitting: Psychiatry

## 2019-03-17 ENCOUNTER — Ambulatory Visit: Payer: BC Managed Care – PPO | Admitting: Psychiatry

## 2019-04-15 ENCOUNTER — Ambulatory Visit: Payer: BC Managed Care – PPO | Admitting: Psychiatry

## 2019-04-29 ENCOUNTER — Ambulatory Visit: Payer: BC Managed Care – PPO | Admitting: Psychiatry

## 2019-05-28 DIAGNOSIS — E119 Type 2 diabetes mellitus without complications: Secondary | ICD-10-CM | POA: Diagnosis not present

## 2019-05-28 DIAGNOSIS — F430 Acute stress reaction: Secondary | ICD-10-CM | POA: Diagnosis not present

## 2019-07-31 DIAGNOSIS — J329 Chronic sinusitis, unspecified: Secondary | ICD-10-CM | POA: Diagnosis not present

## 2019-07-31 DIAGNOSIS — Z20828 Contact with and (suspected) exposure to other viral communicable diseases: Secondary | ICD-10-CM | POA: Diagnosis not present

## 2019-08-11 DIAGNOSIS — F430 Acute stress reaction: Secondary | ICD-10-CM | POA: Diagnosis not present

## 2019-08-11 DIAGNOSIS — E119 Type 2 diabetes mellitus without complications: Secondary | ICD-10-CM | POA: Diagnosis not present

## 2019-08-12 DIAGNOSIS — E119 Type 2 diabetes mellitus without complications: Secondary | ICD-10-CM | POA: Diagnosis not present

## 2019-10-09 DIAGNOSIS — R1032 Left lower quadrant pain: Secondary | ICD-10-CM | POA: Diagnosis not present

## 2019-11-23 DIAGNOSIS — N23 Unspecified renal colic: Secondary | ICD-10-CM | POA: Diagnosis not present

## 2019-11-23 DIAGNOSIS — E119 Type 2 diabetes mellitus without complications: Secondary | ICD-10-CM | POA: Diagnosis not present

## 2019-11-26 DIAGNOSIS — N200 Calculus of kidney: Secondary | ICD-10-CM | POA: Diagnosis not present

## 2019-11-28 DIAGNOSIS — N200 Calculus of kidney: Secondary | ICD-10-CM | POA: Diagnosis not present

## 2019-11-28 DIAGNOSIS — K5730 Diverticulosis of large intestine without perforation or abscess without bleeding: Secondary | ICD-10-CM | POA: Diagnosis not present

## 2019-11-28 DIAGNOSIS — N132 Hydronephrosis with renal and ureteral calculous obstruction: Secondary | ICD-10-CM | POA: Diagnosis not present

## 2019-11-28 DIAGNOSIS — R109 Unspecified abdominal pain: Secondary | ICD-10-CM | POA: Diagnosis not present

## 2019-11-30 DIAGNOSIS — Z01812 Encounter for preprocedural laboratory examination: Secondary | ICD-10-CM | POA: Diagnosis not present

## 2019-11-30 DIAGNOSIS — Z20822 Contact with and (suspected) exposure to covid-19: Secondary | ICD-10-CM | POA: Diagnosis not present

## 2019-11-30 DIAGNOSIS — N201 Calculus of ureter: Secondary | ICD-10-CM | POA: Diagnosis not present

## 2019-12-02 DIAGNOSIS — Z7984 Long term (current) use of oral hypoglycemic drugs: Secondary | ICD-10-CM | POA: Diagnosis not present

## 2019-12-02 DIAGNOSIS — N201 Calculus of ureter: Secondary | ICD-10-CM | POA: Diagnosis not present

## 2019-12-02 DIAGNOSIS — E119 Type 2 diabetes mellitus without complications: Secondary | ICD-10-CM | POA: Diagnosis not present

## 2020-03-04 DIAGNOSIS — Z23 Encounter for immunization: Secondary | ICD-10-CM | POA: Diagnosis not present

## 2020-03-04 DIAGNOSIS — E782 Mixed hyperlipidemia: Secondary | ICD-10-CM | POA: Diagnosis not present

## 2020-03-04 DIAGNOSIS — N529 Male erectile dysfunction, unspecified: Secondary | ICD-10-CM | POA: Diagnosis not present

## 2020-03-04 DIAGNOSIS — Z0000 Encounter for general adult medical examination without abnormal findings: Secondary | ICD-10-CM | POA: Diagnosis not present

## 2020-03-04 DIAGNOSIS — E119 Type 2 diabetes mellitus without complications: Secondary | ICD-10-CM | POA: Diagnosis not present

## 2020-03-04 DIAGNOSIS — Z79899 Other long term (current) drug therapy: Secondary | ICD-10-CM | POA: Diagnosis not present

## 2020-03-04 DIAGNOSIS — E559 Vitamin D deficiency, unspecified: Secondary | ICD-10-CM | POA: Diagnosis not present

## 2020-03-04 DIAGNOSIS — F430 Acute stress reaction: Secondary | ICD-10-CM | POA: Diagnosis not present

## 2020-08-01 DIAGNOSIS — Z86010 Personal history of colonic polyps: Secondary | ICD-10-CM | POA: Diagnosis not present

## 2020-08-09 DIAGNOSIS — Z1159 Encounter for screening for other viral diseases: Secondary | ICD-10-CM | POA: Diagnosis not present

## 2020-08-11 DIAGNOSIS — K5730 Diverticulosis of large intestine without perforation or abscess without bleeding: Secondary | ICD-10-CM | POA: Diagnosis not present

## 2020-08-11 DIAGNOSIS — D125 Benign neoplasm of sigmoid colon: Secondary | ICD-10-CM | POA: Diagnosis not present

## 2020-08-11 DIAGNOSIS — Z86010 Personal history of colonic polyps: Secondary | ICD-10-CM | POA: Diagnosis not present

## 2020-08-11 DIAGNOSIS — D122 Benign neoplasm of ascending colon: Secondary | ICD-10-CM | POA: Diagnosis not present

## 2020-09-08 DIAGNOSIS — N529 Male erectile dysfunction, unspecified: Secondary | ICD-10-CM | POA: Diagnosis not present

## 2020-09-08 DIAGNOSIS — R972 Elevated prostate specific antigen [PSA]: Secondary | ICD-10-CM | POA: Diagnosis not present

## 2020-09-08 DIAGNOSIS — E119 Type 2 diabetes mellitus without complications: Secondary | ICD-10-CM | POA: Diagnosis not present

## 2020-09-08 DIAGNOSIS — F430 Acute stress reaction: Secondary | ICD-10-CM | POA: Diagnosis not present

## 2020-09-28 DIAGNOSIS — R972 Elevated prostate specific antigen [PSA]: Secondary | ICD-10-CM | POA: Diagnosis not present

## 2020-10-19 DIAGNOSIS — R972 Elevated prostate specific antigen [PSA]: Secondary | ICD-10-CM | POA: Diagnosis not present

## 2020-11-17 DIAGNOSIS — R972 Elevated prostate specific antigen [PSA]: Secondary | ICD-10-CM | POA: Diagnosis not present

## 2021-02-27 ENCOUNTER — Other Ambulatory Visit: Payer: Self-pay

## 2021-02-27 ENCOUNTER — Ambulatory Visit: Payer: BC Managed Care – PPO | Admitting: Psychiatry

## 2021-02-27 DIAGNOSIS — F411 Generalized anxiety disorder: Secondary | ICD-10-CM | POA: Diagnosis not present

## 2021-02-27 NOTE — Progress Notes (Signed)
[facility] Counselor/Therapist Progress Note  Patient ID: Bryan Wood, Bryan Wood MRN: [id number],    Date: 02/27/2021  Time Spent: 60 minutes   Treatment Type: Individual Therapy  Reported Symptoms: anxiety, depression, past trauma worked through,  "narcissim", anger my whole life; death of dad 03/25/2021wife in bad wreck but did survive in 03/25/18co-habitating with wife but living as "room-mates" and wife is looking for another place to live.   Mental Status Exam:  Appearance:   Neat     Behavior:  Appropriate, Sharing, and Motivated  Motor:  Normal  Speech/Language:   Clear and Coherent  Affect:  Depressed, anxious  Mood:  anxious and depressed  Thought process:  normal  Thought content:    WNL  Sensory/Perceptual disturbances:    WNL  Orientation:  oriented to person, place, time/date, situation, day of week, month of year, year, and stated date of February 27, 2021  Attention:  Good  Concentration:  Good  Memory:  WNL  Fund of knowledge:   Good  Insight:    Good and Fair  Judgment:   Good and Fair  Impulse Control:  Fair and Poor   Risk Assessment: Danger to Self:  No Self-injurious Behavior: No Danger to Others: No Duty to Warn:no Physical Aggression / Violence:No  Access to Firearms a concern: No  Gang Involvement:No   Subjective:  Patient in today reporting anxiety, depression, impulsive at times, living as "room-mate" with wife until she finds another place to live. (Previously  saw Fred May, therapist, but he's resigned so I agreed to see him.) Depression he reports is his stronger symptom. Impulsivity is some better.  Good insight on some things. Has girlfriend and they hope to eventually marry. Realizing he and girlfriend have some very different points of view on politics, Covid, and other points of view which led to them split but are now back together again but not living together.  Disagrees with the whole Covid situation and how it's been handled,  feels like it's been blown out of proportion. 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 States I want to change how I hurt people "like writing them off too quickly", I have a sense of entitlement, I want to become gentler and kinder as I get older,and  not be so narcissistic. Had been feeling worse "back when I called for an appt" but not is more optimistic and feeling some better.  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 Still some addictive behaviors that I'd like to get to the root of.  Takes xanax per PCP for anxiety. Doesn't want to take more meds.  States his priority for change is the last item on the list below "stop addictive behaviors and deciding I am going to do it".  States he wants to: Better understand and get to the root of addictive behaviors. Am I narcissistic? Dont' want to just "write people off". Dont' want to be impulsive. Don't want my emotions ruling me. Stop addictive behaviors. ("Got to decide first I'm going to it.")    Interventions: Cognitive Behavioral Therapy, Ego-Supportive, and Insight-Oriented  Diagnosis:   ICD-10-CM   1. Generalized anxiety disorder  F41.1       Plan:  Patient today showing good motivation and articulates his concerns quite well as well as what he feels motivated to work on as noted above in "subjective" section.  Patient previously saw another therapist who has since resigned , and I agreed  to take him on as a patient.  Patient was good at providing prior information as well as what is currently going on in his life and where his priorities lie in terms of his motivation for change at this time.  As noted above he wants to work on his "addictive behaviors within himself and within relationships".  Patient encouraged in the meantime to make some written notes as far as his commitment to treatment goals that have been discussed today.  He is to specifically include some writing about his addictive behaviors and his priorities and level of  commitment.   Goal review and progress/challenges noted with patient.  Next appointment within 2 weeks.  (This record has been created using [employer].  Chart creation errors have been sought, but may not always have been located and corrected.  Such creation errors do not reflect on the standard of medical care.)   Mathis Fare, LCSW

## 2021-03-10 DIAGNOSIS — E559 Vitamin D deficiency, unspecified: Secondary | ICD-10-CM | POA: Diagnosis not present

## 2021-03-10 DIAGNOSIS — E119 Type 2 diabetes mellitus without complications: Secondary | ICD-10-CM | POA: Diagnosis not present

## 2021-03-10 DIAGNOSIS — R972 Elevated prostate specific antigen [PSA]: Secondary | ICD-10-CM | POA: Diagnosis not present

## 2021-03-10 DIAGNOSIS — Z0000 Encounter for general adult medical examination without abnormal findings: Secondary | ICD-10-CM | POA: Diagnosis not present

## 2021-03-10 DIAGNOSIS — Z13220 Encounter for screening for lipoid disorders: Secondary | ICD-10-CM | POA: Diagnosis not present

## 2021-03-10 DIAGNOSIS — E782 Mixed hyperlipidemia: Secondary | ICD-10-CM | POA: Diagnosis not present

## 2021-03-16 ENCOUNTER — Other Ambulatory Visit: Payer: Self-pay

## 2021-03-16 ENCOUNTER — Ambulatory Visit: Payer: BC Managed Care – PPO | Admitting: Psychiatry

## 2021-03-16 DIAGNOSIS — E119 Type 2 diabetes mellitus without complications: Secondary | ICD-10-CM | POA: Diagnosis not present

## 2021-03-16 DIAGNOSIS — F411 Generalized anxiety disorder: Secondary | ICD-10-CM | POA: Diagnosis not present

## 2021-03-16 DIAGNOSIS — N529 Male erectile dysfunction, unspecified: Secondary | ICD-10-CM | POA: Diagnosis not present

## 2021-03-16 DIAGNOSIS — Z0000 Encounter for general adult medical examination without abnormal findings: Secondary | ICD-10-CM | POA: Diagnosis not present

## 2021-03-16 DIAGNOSIS — F430 Acute stress reaction: Secondary | ICD-10-CM | POA: Diagnosis not present

## 2021-03-16 DIAGNOSIS — E782 Mixed hyperlipidemia: Secondary | ICD-10-CM | POA: Diagnosis not present

## 2021-03-16 NOTE — Progress Notes (Signed)
[facility] Counselor/Therapist Progress Note  Patient ID: Haskel, Dewalt MRN: [id number],    Date: 03/16/2021  Time Spent: 58 minutes   Treatment Type: Individual Therapy  Reported Symptoms: anxiety (increased), anger, "no depression"  Mental Status Exam:  Appearance:   Casual     Behavior:  Appropriate, Sharing, and Motivated  Motor:  Normal  Speech/Language:   Clear and Coherent and Normal Rate  Affect:  anxious  Mood:  angry and anxious  Thought process:  goal directed  Thought content:    WNL  Sensory/Perceptual disturbances:    WNL  Orientation:  oriented to person, place, time/date, situation, day of week, month of year, year, and stated date of March 16, 2021  Attention:  Fair  Concentration:  Fair  Memory:  WNL  Fund of knowledge:   Good  Insight:    Good  Judgment:   Good  Impulse Control:  Good   Risk Assessment: Danger to Self:  No Self-injurious Behavior: No Danger to Others: No Duty to Warn:no Physical Aggression / Violence:No  Access to Firearms a concern: No  Gang Involvement:No   Subjective:  Discussing more today what he is wanting to focus on--his addiction (p)  and his anxiety. Anxiety has increased. Denies any depression. But has some anger. Really stresses if/when money is tight. Uses now because it's comforting, there is a pleasure to it. 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 Looking at realistic plan to stop using that he can agree to commit to. Outlined a plan of action including cleaning out, removal, and sticking with his plan and targeted change of going 48 hours with changed behavior that he is desiring.  Also, cutting off all contact with others who are destructive for him.  *(Not all details included in this note due to patient privacy needs.)  Seems motivated we will measure progress next visit, as well as  determine next steps.  Good insight continues and seems realistically motivated knowing that he is needing to step up and follow through on goal-directed behaviors.   Interventions: Cognitive Behavioral Therapy and Solution-Oriented/Positive Psychology  Diagnosis:   ICD-10-CM   1. Generalized anxiety disorder  F41.1        Plan:  Patient today showing good motivation during session and we worked collaboratively on a plan for some specific behavioral changes between now and next session, addressing his addictive behaviors within himself and within relationships..  Target behaviors agreed upon and patient motivated for change.  Encouraged patient to get outside daily and walk, be in contact with supportive people, stay focused on his goals and notice any improvement, build upon his successes, practicing positive self talk, set limits and boundaries with others as needed, look for positives within himself, follow through on his intentions, stay in the present focused on what he can control or change versus cannot, and feel good about the strength he is showing as he uses goal-directed behaviors in the midst of challenging circumstances to move forward in a more positive direction.    Goal review and progress/challenges noted with patient.   Next appointment within 2 weeks.   (This record has been created using [employer].  Chart creation errors have been sought, but may not always have been located and corrected.  Such creation errors do not reflect on the standard  of medical care.)    Mathis Fare, LCSW

## 2021-03-17 ENCOUNTER — Ambulatory Visit: Payer: BC Managed Care – PPO | Admitting: Psychiatry

## 2021-03-27 ENCOUNTER — Ambulatory Visit: Payer: BC Managed Care – PPO | Admitting: Psychiatry

## 2021-03-27 ENCOUNTER — Other Ambulatory Visit: Payer: Self-pay

## 2021-03-27 DIAGNOSIS — F411 Generalized anxiety disorder: Secondary | ICD-10-CM

## 2021-03-27 NOTE — Progress Notes (Signed)
[facility] Counselor/Therapist Progress Note  Patient ID: Bryan Wood, Bryan Wood MRN: [id number],    Date: 03/27/2021  Time Spent: 58 minutes   Treatment Type: Individual Therapy  Reported Symptoms: anxiety, irritability  Mental Status Exam:  Appearance:   Casual     Behavior:  Appropriate, Sharing, and Motivated  Motor:  Normal  Speech/Language:   Clear and Coherent  Affect:  Anxious, irritable  Mood:  anxious and irritable  Thought process:  goal directed  Thought content:    Some obsessiveness  Sensory/Perceptual disturbances:    WNL  Orientation:  oriented to person, place, time/date, situation, day of week, month of year, year, and stated date of March 27, 2021  Attention:  Fair  Concentration:  Fair  Memory:  WNL  Fund of knowledge:   Good  Insight:    Good  Judgment:   Good  Impulse Control:  Fair   Risk Assessment: Danger to Self:  No Self-injurious Behavior: No Danger to Others: No Duty to Warn:no Physical Aggression / Violence:No  Access to Firearms a concern: No  Gang Involvement:No   Subjective:  Patient in today reporting some success with his goals most recently, specifically stopping an addictive behavior, "stopped once for 2 days", but did have some additional insights in his efforts including: to stop his habits "feels like a part of me is dying" but also says "that unhealthy part doe need to die off". Strong self identification, security blanket feature, doing it for so long, everything seems to trigger it (stress, driving, boredom). Feels like he should be feeling better about himself, but "I've always been judgmental of myself".  Processed his strengths, his perceived weaknesses, and obstacles to his moving forward.  Patient has some good skills in terms of self assessment and seems genuine in his desire to make progress on his goal directed behaviors.  Worked today on next steps over the next couple weeks to continue working on his current focus of  change which is directly tied to his goals of interrupting certain specific behaviors as noted in session (not all details included in this note for patient confidentiality), to be less impulsive, to not let my emotions roommate, and to ultimately stop addictive behaviors.  Is motivated and engaged in his treatment process.  Interventions: Cognitive Behavioral Therapy and Insight-Oriented  Diagnosis:   ICD-10-CM   1. Generalized anxiety disorder  F41.1       Plan:  Patient today showing good awareness and motivation, using some helpful visualizations and words (Free) that can help his motivation, perseverance, and sticking to his goals.  To continue his focus on specific behavioral targets which he experienced success with these past couple of weeks and was able to make some good documentation on that which he shared in session.  Encouraged him to use some behaviors that have proven to be helpful before including: Get outside daily and walk, stay focused on his goals and noticed all improvement, stay in contact with supportive people, practice positive self talk, set limits and boundaries with others as needed, build on his successes, look for positives within himself, follow through on his intentions, stay in the present focused on what he can control or change versus cannot, and realize the strength he is showing as he uses goal-directed behaviors in the midst of challenging circumstances to move forward in a more positive direction of improved emotional health.  Goal review and progress/challenges noted with patient.  Next appointment within 2 to  3 weeks.   Shanon Ace, LCSW

## 2021-04-10 ENCOUNTER — Ambulatory Visit: Payer: BC Managed Care – PPO | Admitting: Psychiatry

## 2021-04-10 ENCOUNTER — Other Ambulatory Visit: Payer: Self-pay

## 2021-04-10 DIAGNOSIS — M25512 Pain in left shoulder: Secondary | ICD-10-CM | POA: Diagnosis not present

## 2021-04-10 DIAGNOSIS — M25521 Pain in right elbow: Secondary | ICD-10-CM | POA: Diagnosis not present

## 2021-04-10 DIAGNOSIS — M25511 Pain in right shoulder: Secondary | ICD-10-CM | POA: Diagnosis not present

## 2021-04-10 DIAGNOSIS — F411 Generalized anxiety disorder: Secondary | ICD-10-CM

## 2021-04-10 NOTE — Progress Notes (Signed)
[facility] Counselor/Therapist Progress Note  Patient ID: Zakariah, Urwin MRN: [id number],    Date: 04/10/2021  Time Spent: 50 minutes   Treatment Type: Individual Therapy  Reported Symptoms: anxiety, frustrations, easily angered  Mental Status Exam:  Appearance:   Casual     Behavior:  Appropriate, Sharing, and Motivated  Motor:  Normal  Speech/Language:   Clear and Coherent  Affect:  Anxious  Mood:  anxious  Thought process:  goal directed  Thought content:    WNL  Sensory/Perceptual disturbances:    WNL  Orientation:  oriented to person, place, time/date, situation, day of week, month of year, year, and stated date of Aug. 8, 2022  Attention:  Good  Concentration:  Good  Memory:  WNL  Fund of knowledge:   Good  Insight:    Good  Judgment:   Good  Impulse Control:  Good   Risk Assessment: Danger to Self:  No Self-injurious Behavior: No Danger to Others: No Duty to Warn:no Physical Aggression / Violence:No  Access to Firearms a concern: No  Gang Involvement:No   Subjective:   Patient in today reporting that he has continued goal-directed behaviors of decreasing further his substance abuse behavior (not all details included in note due patient privacy concerns).  Has had more success in reducing these behaviors since last session. "Is making me more irritable" but noticing a difference in "my buzz self and my sober self".  Buzz self was lazy and apathetic and sober self is more energetic, humorous, and clear thinking.  Is impatient some with himself. Starting to try and leave old self-identity of using to a new identity as he works to stop using. Realizing "how I've had all sorts of triggers to my using and am trying to  not let them catch me off guard." Not quite a judgmental of himself.  "Pushing past my comfort zone in not using and that is going better than expected, but still realizing I have a ways to go as I just started this process within 1 month ago".   Seeing his strengths and obstacles, yet continues to move forward. Good self-motivation and monitoring. "Having my Planner and To Do List helps a lot to keep me on track." Discussed next steps over the next couple weeks. Feeling less impulsive  and trying to be "ok with the discomfort of not using". Looking at his challenges in positive ways and encouraging himself. Definitely motivated and pushing in more positive direction.    Interventions: Cognitive Behavioral Therapy  Diagnosis:   ICD-10-CM   1. Generalized anxiety disorder  F41.1       Plan:  Patient today showing continued motivation and good self-awareness.  Is making progress on his goals and reports using helpful visualizations and words through "Free" that can help his motivation, his progress and sticking to his goal-directed work.  He continues to focus on specific behavioral targets and is experiencing continued success even though he is early in this process.  Encouraged him to practice behaviors that have proven to be helpful before including: Stay focused on his goals and noticed all improvement, stay in contact with supportive people, get outside daily and walk, practice positive self talk, set limits and boundaries with others as needed, build on his success, look for positives within himself, follow through on his intentions, stay in the present focused on what he can control or change versus cannot, and feel good about the strength he is showing as he  uses goal-directed behaviors in the midst of challenging circumstances to move forward in a more positive direction of improved emotional health.  Goal review and progress/challenges noted with patient.  Next appointment within 2 to 3 weeks.   Mathis Fare, LCSW

## 2021-04-24 ENCOUNTER — Other Ambulatory Visit: Payer: Self-pay

## 2021-04-24 ENCOUNTER — Ambulatory Visit: Payer: BC Managed Care – PPO | Admitting: Psychiatry

## 2021-04-24 DIAGNOSIS — F411 Generalized anxiety disorder: Secondary | ICD-10-CM | POA: Diagnosis not present

## 2021-04-24 NOTE — Progress Notes (Signed)
[facility] Counselor/Therapist Progress Note  Patient ID: York, Valliant MRN: [id number],    Date: 04/24/2021  Time Spent: 57 minutes  Treatment Type: Individual Therapy  Reported Symptoms: anxiety, frustrated  Mental Status Exam:  Appearance:   Casual     Behavior:  Appropriate, Sharing, and Motivated  Motor:  Normal  Speech/Language:   Clear and Coherent  Affect:  anxious  Mood:  anxious  Thought process:  goal directed  Thought content:    Some obsessivness  Sensory/Perceptual disturbances:    WNL  Orientation:  oriented to person, place, time/date, situation, day of week, month of year, year, and stated date of Aug. 22, 2022  Attention:  Good  Concentration:  Good  Memory:  WNL  Fund of knowledge:   Good  Insight:    Good  Judgment:   Good and Fair  Impulse Control:  Good and Fair   Risk Assessment: Danger to Self:  No Self-injurious Behavior: No Danger to Others: No Duty to Warn:no Physical Aggression / Violence:No  Access to Firearms a concern: No  Gang Involvement:No   Subjective:  Patient in today reporting significant anxiety and frustration as he continues his SA goal of stopping his usage.  Made some good progress within past month but this past week was discouraging and challenging. "But I'm still progressing, just not as fast as I had hoped."  Working on some patience with himself. Initially very critical and self-judging and not giving credit to some of the positive steps he has taken in not purchasing and setting limits in spite of challenges.  Discussed this in more detail today as well as some behaviors that might be more productive for him in terms of meeting his goals.  Most recently he's had some "real issues with his GF with whom he's been for over 10 yrs but some big differences in their outlook, their view of certain things that are important to patient. Processed concerns re: the growing differences Some agitation re: relationship with GF  and with his goal-directed SA efforts. Shared some about his agitation and frustration and how that impacts his success in SA goals.  States he is going to keep same goals for the next couple weeks in cutting back usage to include at least 2 days back-to-back. Less judgmental, trying to recognize triggers more quickly, not quite as judgmental, "pushing past his comfort zone" trying to cut back, old identity vs new identity, seeing his strengths and obstacles, self-motivated and monitoring. States his planner and to-do lists "help a lot"! , trying to "set timer delay activity he is trying to stop, and working hard to be ok and tolerate better the discomfort of not using.Trying to encourage himself in more positive versus negative ways. Motivated and has made some measurable progress.    Interventions: Cognitive Behavioral Therapy and Insight-Oriented  Diagnosis:   ICD-10-CM   1. Generalized anxiety disorder  F41.1       Plan: Patient today showing good motivation and remains committed to his treatment goals specifically related to SA behavior.  (Not all details are included in this note due to patient privacy needs.)  Has good self awareness and able to call himself out when he veers off track and in holding himself accountable.  Is making progress although not quite at the rate he had wanted.  Continues to use helpful visualizations and specific words including the word "Free" which she feels is a reminder and helps him stay aligned  with his goal-directed work.  Using behavioral targets and is having some success but as noted above, not as rapidly as he had wanted.  Emphasizing the fact that he has maintained his gains, seems to be helpful to patient.  Encouraged him to practice behaviors that have proven to be helpful before including: Practice positive self talk consistently, set limits and boundaries with others as needed, look for positives within himself, build on his success, stay focused on his  goals and notice all improvement, stay in contact with supportive people, get outside daily and walk, follow through on his intentions, stay in the present focused on what he can control or change versus cannot, and recognize the strength he is showing as he uses goal-directed behaviors in the midst of challenging circumstances to move forward in a more positive direction of improved emotional health.  Goal review and progress/challenges noted with patient.  Next appointment within 2 to 3 weeks.   Mathis Fare, LCSW

## 2021-05-09 ENCOUNTER — Encounter: Payer: BC Managed Care – PPO | Admitting: Psychiatry

## 2021-05-09 NOTE — Progress Notes (Signed)
This encounter was created in error - please disregard.  This encounter was created in error - please disregard.

## 2021-05-22 ENCOUNTER — Ambulatory Visit: Payer: BC Managed Care – PPO | Admitting: Psychiatry

## 2021-05-22 ENCOUNTER — Other Ambulatory Visit: Payer: Self-pay

## 2021-05-22 DIAGNOSIS — F411 Generalized anxiety disorder: Secondary | ICD-10-CM

## 2021-05-22 NOTE — Progress Notes (Signed)
[facility] Counselor/Therapist Progress Note  Patient ID: Bryan Wood, Bryan Wood MRN: [id number],    Date: 05/22/2021  Time Spent: 50 minutes   Treatment Type: Individual Therapy  Reported Symptoms: anxiety, some depression, difficulty staying goal-focused  Mental Status Exam:  Appearance:   Casual     Behavior:  Appropriate, Sharing, and Motivated  Motor:  Normal  Speech/Language:   Anxious, some depression  Affect:  Anxious, some depression  Mood:  anxious, some depression  Thought process:  goal directed  Thought content:    Some obsessiveness  Sensory/Perceptual disturbances:    WNL  Orientation:  oriented to person, place, time/date, situation, day of week, month of year, year, and stated date of Sept 19, 2022  Attention:  Good.Fair  Concentration:  Good/Fair  Memory:  WNL  Fund of knowledge:   Good  Insight:    Good  Judgment:   Fair  Impulse Control:  Fair and Poor   Risk Assessment: Danger to Self:  No Self-injurious Behavior: No Danger to Others: No Duty to Warn:no Physical Aggression / Violence:No  Access to Firearms a concern: No  Gang Involvement:No   Subjective:  Patient in today reporting stress and loss of relationship, leading him to feel more anxious and some more depression.  Denies any SI. Also has not met his SA goal of stopping his use, although had made prior success, but now "starting again".  Worked today with some of his increased depression, feeling down on himself, self-judging, discouraged and "hard to feel re-dedicated to his original SA goal". Processed a lot of his anxious/depressive thoughts that have been holding him back, trying to interrupt those thoughts and challenge them and replace with more reality-based and empowering thoughts that do not feed his anxiety nor depression. States that he really feels the best thing for himself is to get part-time job to have more structured time and get back on track with his original SA goal. Trying  to "practice more patience with himself and the process" and yet take it very serious and remain  committed.  Also encouraged him and related to his SA goal, to see his strengths more than weaknesses/obstacles, and to pick back up on some of the things that have helped him before including using a planner, "to do lists", setting a timer re: stopping certain activities or behaviors, exercise, and being more tolerant of feelings associated with non--use, encouraging himself in more positive and affirming ways that can help him with his motivation.  Interventions: Solution-Oriented/Positive Psychology, Ego-Supportive, and Insight-Oriented  Diagnosis:   ICD-10-CM   1. Generalized anxiety disorder  F41.1          Plan:  Patient today showing good motivation and actively involved in session.  Feeling down on himself as noted above for not maintaining goal-directed behavior especially in regards to an SA goal. Worked well in session on talking through some difficult issues involving a relationship as well as him feeling as though he failed with the goal he had set for himself.  As noted above, he was able to talk through a lot of this and eventually reach a point of being a little more self affirming, and expressed a desire to not have his "emotions rule me", and wanting to get back on track of making progress towards all of his goals including SA goal of not using.  He firmly believes he can do this as he has done it before, and has good self awareness and  able to call himself out when he veers off track and in holding himself accountable.  He gets discouraged when his progress is not going at the speed he feels that should go and we discussed how this can be counter productive for him at times.  Reports using helpful visualizations and specific words can help him in the process of getting back on track and headed towards his goal.  Encouraged patient to practice positive behaviors that can be helpful to him  including: Consistent positive self talk, setting limits and boundaries with others as needed, looking for positives within himself, remember his progress and success previously and have encourage to push forward towards his goals, staying focused on his goals and noticing all improvement even if it is in small bits, stay in contact with supportive people, get outside daily and walk, follow through on his intentions, stay in the present focused on what he can control or change versus cannot, and recognize the strength he is showing as he uses goal-directed behaviors to move forward in a more positive direction of improved emotional health.   Goal review and progress/challenges noted with patient.  Next appointment within 2 to 3 weeks.   Shanon Ace, LCSW

## 2021-06-05 ENCOUNTER — Ambulatory Visit: Payer: BC Managed Care – PPO | Admitting: Psychiatry

## 2021-06-05 ENCOUNTER — Other Ambulatory Visit: Payer: Self-pay

## 2021-06-05 DIAGNOSIS — F411 Generalized anxiety disorder: Secondary | ICD-10-CM | POA: Diagnosis not present

## 2021-06-05 NOTE — Progress Notes (Signed)
[facility] Counselor/Therapist Progress Note  Patient ID: Bryan Wood, Bryan Wood MRN: [id number],    Date: 06/05/2021  Time Spent: 52 minutes   Treatment Type: Individual Therapy  Reported Symptoms: sad, depressed, anxiety, discouragement, broke up with GF and has remained broken up  Mental Status Exam:  Appearance:   Casual and Neat     Behavior:  Appropriate and Sharing  Motor:  Normal  Speech/Language:   Clear and Coherent  Affect:  Depressed and anxiety  Mood:  anxious and depressed  Thought process:  goal directed  Thought content:    Some obsessiveness  Sensory/Perceptual disturbances:    WNL  Orientation:  oriented to person, place, time/date, situation, day of week, month of year, year, and stated date of Oct. 3, 2022  Attention:  Good  Concentration:  Good  Memory:  WNL  Fund of knowledge:   Good  Insight:    Good and Fair  Judgment:   Fair  Impulse Control:  Fair and Poor   Risk Assessment: Danger to Self:  No Self-injurious Behavior: No Danger to Others: No Duty to Warn:no Physical Aggression / Violence:No  Access to Firearms a concern: No  Gang Involvement:No   Subjective: Patient in today reporting anxiety, depression, discouragement, and sadness especially over breakup with longtime GF.  Has not been able to successfully break bad habits that affect his health mentally, physically, and emotionally. Self-defeating behaviors and thoughts, and focusing on the negatives. Frustrated with himself.  Beating up himself and realizing that is self-defeating at this point. Denies any SI. Continues work on his anxiety and depression, feeling down on himself. Needing to access more clarity for himself and more determination in being more goal-focused and having good follow-through, discussed ways for him to do this and push forward as he has on prior occasions.  Worked more today on his being so down on himself, discouraged, and self judging which he did some good work  with and I think left after the session feeling less self defeated.  Encouraged his follow through on goal-directed behaviors and to believe more in himself that he can make significant changes for the better and would not feed his anxiety and depression.  While being goal focused, he also needs to have some patience with the process and himself and know that changes do not occur overnight and the sooner he gets started in a more positive direction the sooner he will notice progress.  Emphasized positive self talk and noticing more of his strengths rather than his weaknesses.  Interventions: Solution-Oriented/Positive Psychology, Ego-Supportive, and Insight-Oriented  Diagnosis:   ICD-10-CM   1. Generalized anxiety disorder  F41.1      Treatment goal plan: Patient not signing treatment goal plan on computer screen due to COVID. Treatment goals: Treatment goals remain on treatment plan as patient works with strategies to achieve his goals.  Progress is assessed each session and noted in "progress" section of the treatment note. Long-term goal: Reduce overall level, frequency, and intensity of the anxiety/depression so that daily functioning is not impaired. Short term goal: Increase understanding of beliefs and messages that produce worry, anxiety, and depressed feelings. Strategies: Identify, challenge, and replace fearful/anxious/depressive self talk with positive, realistic, and empowering self talk.    Plan:  Patient's motivation today was not as good at the beginning of session however did escalate some as we worked on issues together.  Worked hard today, as noted above, to get back some of his motivation  and also a sense of being more goal-directed in his behavior especially regarding his SA goal.  He talked through a lot of his concerns and self-defeating thoughts and eventually arrived at a place of being some more self affirming, not wanting to be ruled by his emotions and wanting to  get back on track of making progress towards his goals.  He knows that he can accomplish things with some perseverance as he has experienced setbacks before as well as comebacks.  Does need to be more reasonable in his expectations of the speed at which true change occurs.  Encouraged patient in practicing some positive behaviors that has been helpful to him previously including: Looking for positives within himself, remember his progress and success previously and that that helped him push forward towards his goals, more consistent positive self talk, setting limits and boundaries with others as needed, staying focused on his goals and noticing all improvement even if it is in small bits, being more patient with himself, staying in contact with supportive people, get outside daily and walk, follow through on his intentions for his goals, stay in the present focusing on what he can control or change versus cannot, and recognize the strength he is showing as he uses goal-directed behaviors to move forward in a direction that supports improved emotional health and stability.  Goal review and progress/challenges noted with patient.  Next appointment within 2 weeks.  This record has been created using [employer].  Chart creation errors have been sought, but may not always have been located and corrected.  Such creation errors do not reflect on the standard of medical care provided.    Mathis Fare, LCSW

## 2021-06-19 ENCOUNTER — Other Ambulatory Visit: Payer: Self-pay

## 2021-06-19 ENCOUNTER — Ambulatory Visit: Payer: BC Managed Care – PPO | Admitting: Psychiatry

## 2021-06-19 DIAGNOSIS — F411 Generalized anxiety disorder: Secondary | ICD-10-CM | POA: Diagnosis not present

## 2021-06-19 NOTE — Progress Notes (Signed)
[facility] Counselor/Therapist Progress Note  Patient ID: Bryan Wood, Bryan Wood MRN: [id number],     Date: 06/19/2021  Time Spent: 58  minutes   Treatment Type: Individual Therapy  Reported Symptoms: anxiety  Mental Status Exam:  Appearance:   Casual     Behavior:  Appropriate, Sharing, and Motivated  Motor:  Normal  Speech/Language:   Clear and Coherent  Affect:  anxious  Mood:  anxious  Thought process:  goal directed  Thought content:    Some obsessiveness  Sensory/Perceptual disturbances:    WNL  Orientation:  oriented to person, place, time/date, situation, day of week, month of year, year, and stated date of Oct. 17, 2022  Attention:  Good  Concentration:  Good  Memory:  WNL  Fund of knowledge:   Good  Insight:    Good and Fair  Judgment:   Good  Impulse Control:  Good and Fair   Risk Assessment: Danger to Self:  No Self-injurious Behavior: No Danger to Others: No Duty to Warn:no Physical Aggression / Violence:No  Access to Firearms a concern: No  Gang Involvement:No   Subjective: Patient in today reporting anxiety and very frustrated about still having to wear a mask in medical facilities including here. Daily anxiety "but no worse."  Past couple weeks, has gotten better and feeling more motivated to stop making poor choices. Less depression, definitely improved some. 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 Feels the discipline  involved in this work, is helping him personally to have better self-esteem and more drive to focus on his goals and make healthier decisions. To work on exercise discussed in session today re: listing "Things that lead him to feel positive", also listing "Things I do daily", and then see where the changes are that he needs to make.  Reading some that he feels is helping including books on substance abuse, which has helped him in his struggles. Feels he is doing better coping with breakup with longtime GF. Worked more  on some self-defeating behaviors and focusing on the negatives, to instead, change the behaviors he needs to change and focus on the positives. Trying to believe more in himself and understand he can influence his life in a much more positive direction and is working on th necessary self-determination and commitment.   Interventions: Solution-Oriented/Positive Psychology, Ego-Supportive, and Insight-Oriented  Diagnosis:   ICD-10-CM   1. Generalized anxiety disorder  F41.1      Treatment goal plan: Patient not signing treatment goal plan on computer screen due to COVID. Treatment goals: Treatment goals remain on treatment plan as patient works with strategies to achieve his goals.  Progress is assessed each session and noted in "progress" section of the treatment note. Long-term goal: Reduce overall level, frequency, and intensity of the anxiety/depression so that daily functioning is not impaired. Short term goal: Increase understanding of beliefs and messages that produce worry, anxiety, and depressed feelings. Strategies: Identify, challenge, and replace fearful/anxious/depressive self talk with positive, realistic, and empowering self talk.     Plan: Patient today showing better motivation and engagement in session, working on his anxieties, some depression, and more importantly his need to interrupt problematic thoughts that lead to destructive choices. Shared that consistency in good choices and habits, he feels will help him and we discussed how he can decide to be more consistent and follow through and he seemed to relate well to this.  Recognizes that he needs to maintain his motivation  when he is out on his own and needs to make good decisions and "say no" to certain things that are destructive for him.  Used some goal-directed behaviors as an example in clarifying this for him.  Encouraged patient to be practicing positive behaviors in between sessions including: much more consistent  encouraging positive self talk, setting limits and boundaries with others as needed, staying focused on his goals and noticing all improvement even if it is small, looking for the positives within himself, remembering/reflecting on his progress and success previously and noticing what helped him push forward towards his goals on those occasions, being more patient with himself, staying in contact with supportive people, getting outside daily and walking, follow through on his intentions for his goals, stay in the present focusing on what he can change or control, believing himself, and realize the strength he shows working with goal-directed behaviors to move in a direction that supports overall stability and improved emotional health.  Goal review and progress/challenges noted with patient.  Next appointment within 2 to 3 weeks.  This record has been created using [employer].  Chart creation errors have been sought, but may not always have been located and corrected.  Such creation errors do not reflect on the standard of medical care provided.    Mathis Fare, LCSW

## 2021-07-03 ENCOUNTER — Ambulatory Visit: Payer: BC Managed Care – PPO | Admitting: Psychiatry

## 2021-07-17 ENCOUNTER — Other Ambulatory Visit: Payer: Self-pay

## 2021-07-17 ENCOUNTER — Ambulatory Visit: Payer: BC Managed Care – PPO | Admitting: Psychiatry

## 2021-07-17 DIAGNOSIS — F411 Generalized anxiety disorder: Secondary | ICD-10-CM

## 2021-07-17 NOTE — Progress Notes (Signed)
[facility] Counselor/Therapist Progress Note  Patient ID: Kerwin, Augustus MRN: [id number],    Date: 07/17/2021  Time Spent: 48 minutes   Treatment Type: Individual Therapy  Reported Symptoms: anxiety, depression  Mental Status Exam:  Appearance:   Well Groomed     Behavior:  Appropriate, Sharing, and Motivated  Motor:  Normal  Speech/Language:   Clear and Coherent  Affect:  Anxious,  Mood:  anxious  Thought process:  goal directed  Thought content:    WNL  Sensory/Perceptual disturbances:    WNL  Orientation:  oriented to person, place, time/date, situation, day of week, month of year, year, and stated date of Nov. 14, 2022  Attention:  Good  Concentration:  Good  Memory:  WNL  Fund of knowledge:   Good  Insight:    Good  Judgment:   Good  Impulse Control:  Good   Risk Assessment: Danger to Self:  No Self-injurious Behavior: No Danger to Others: No Duty to Warn:no Physical Aggression / Violence:No  Access to Firearms a concern: No  Gang Involvement:No   Subjective:        Past couple weeks, has gotten better and feeling more motivated to stop making poor choices. Less depression, definitely improved some. 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 Feels the discipline  involved in this work, is helping him personally to have better self-esteem and more drive to focus on his goals and make healthier decisions. To work on exercise discussed in session today re: listing "Things that lead him to feel positive", also listing "Things I do daily", and then see where the changes are that he needs to make.  Reading some that he feels is helping including books on substance abuse, which has helped him in his struggles. Feels he is doing better coping with breakup with longtime GF. Worked more on some self-defeating behaviors and focusing on the negatives, to instead, change the behaviors he needs to change and focus on the positives. Trying to believe  more in himself and understand he can influence his life in a much more positive direction and is working on th necessary self-determination and commitment.   Interventions: Solution-Oriented/Positive Psychology and Insight-Oriented   Diagnosis:   ICD-10-CM   1. Generalized anxiety disorder  F41.1      Treatment goal plan: Patient not signing treatment goal plan on computer screen due to COVID. Treatment goals: Treatment goals remain on treatment plan as patient works with strategies to achieve his goals.  Progress is assessed each session and noted in "progress" section of the treatment note. Long-term goal: Reduce overall level, frequency, and intensity of the anxiety/depression so that daily functioning is not impaired. Short term goal: Increase understanding of beliefs and messages that produce worry, anxiety, and depressed feelings. Strategies: Identify, challenge, and replace fearful/anxious/depressive self talk with positive, realistic, and empowering self talk.    Plan: Patient today showing moved improved motivation and realistic review of his needs and difficulty following through on setting limits with with behaviors that are not helpful to him.  His motivation in some ways seems stronger today because he is taking this more seriously.  As noted above, we discussed his transfer to a therapist that specializes in one of the areas he is seeking help.  Does show more commitment at this point in that area and after speaking with him at length today we both agree that this is a good next step for him.  I provided him with resource names and phone number and location.  He seems positive about this and is to make the next contact with that provider or location.  Encouraged patient in some of the work he has done here and more importantly in pursuing next steps and becoming more committed to make the positive changes he is needing.    Goal review and progress/challenges noted with  patient.  Next appointment within 2 to 3 weeks.  This record has been created using [employer].  Chart creation errors have been sought, but may not always have been located and corrected.  Such creation errors do not reflect on the standard of medical care provided.    Mathis Fare, LCSW

## 2021-08-07 ENCOUNTER — Ambulatory Visit: Payer: BC Managed Care – PPO | Admitting: Psychiatry

## 2021-09-26 DIAGNOSIS — R030 Elevated blood-pressure reading, without diagnosis of hypertension: Secondary | ICD-10-CM | POA: Diagnosis not present

## 2021-09-26 DIAGNOSIS — E119 Type 2 diabetes mellitus without complications: Secondary | ICD-10-CM | POA: Diagnosis not present

## 2021-09-26 DIAGNOSIS — F430 Acute stress reaction: Secondary | ICD-10-CM | POA: Diagnosis not present

## 2021-09-27 DIAGNOSIS — L821 Other seborrheic keratosis: Secondary | ICD-10-CM | POA: Diagnosis not present

## 2021-09-27 DIAGNOSIS — D1801 Hemangioma of skin and subcutaneous tissue: Secondary | ICD-10-CM | POA: Diagnosis not present

## 2021-09-27 DIAGNOSIS — D2271 Melanocytic nevi of right lower limb, including hip: Secondary | ICD-10-CM | POA: Diagnosis not present

## 2021-09-27 DIAGNOSIS — D225 Melanocytic nevi of trunk: Secondary | ICD-10-CM | POA: Diagnosis not present

## 2022-03-23 DIAGNOSIS — Z125 Encounter for screening for malignant neoplasm of prostate: Secondary | ICD-10-CM | POA: Diagnosis not present

## 2022-03-23 DIAGNOSIS — E559 Vitamin D deficiency, unspecified: Secondary | ICD-10-CM | POA: Diagnosis not present

## 2022-03-23 DIAGNOSIS — E119 Type 2 diabetes mellitus without complications: Secondary | ICD-10-CM | POA: Diagnosis not present

## 2022-03-23 DIAGNOSIS — Z13220 Encounter for screening for lipoid disorders: Secondary | ICD-10-CM | POA: Diagnosis not present

## 2022-03-26 DIAGNOSIS — R972 Elevated prostate specific antigen [PSA]: Secondary | ICD-10-CM | POA: Diagnosis not present

## 2022-03-26 DIAGNOSIS — Z0000 Encounter for general adult medical examination without abnormal findings: Secondary | ICD-10-CM | POA: Diagnosis not present

## 2022-03-26 DIAGNOSIS — E119 Type 2 diabetes mellitus without complications: Secondary | ICD-10-CM | POA: Diagnosis not present

## 2022-03-26 DIAGNOSIS — H6123 Impacted cerumen, bilateral: Secondary | ICD-10-CM | POA: Diagnosis not present

## 2022-03-26 DIAGNOSIS — E782 Mixed hyperlipidemia: Secondary | ICD-10-CM | POA: Diagnosis not present

## 2022-03-26 DIAGNOSIS — F430 Acute stress reaction: Secondary | ICD-10-CM | POA: Diagnosis not present

## 2022-08-14 ENCOUNTER — Telehealth: Payer: Self-pay

## 2022-08-14 NOTE — Patient Outreach (Signed)
  Care Coordination   08/14/2022 Name: Bryan Wood. MRN: [id number] DOB: [birth year]   Care Coordination Outreach Attempts:  An unsuccessful telephone outreach was attempted today to offer the patient information about available care coordination services as a benefit of their health plan.   Follow Up Plan:  Additional outreach attempts will be made to offer the patient care coordination information and services.   Encounter Outcome:  No Answer   Care Coordination Interventions:  No, not indicated    Dudley Major RN, BSN,CCM, CDE Care Management Coordinator [facility] Care Management [phone]

## 2022-08-22 ENCOUNTER — Telehealth: Payer: Self-pay

## 2022-08-22 NOTE — Patient Outreach (Signed)
  Care Coordination   08/22/2022 Name: Bryan Wood. MRN: [id number] DOB: 07-20-1966   Care Coordination Outreach Attempts:  A second unsuccessful outreach was attempted today to offer the patient with information about available care coordination services as a benefit of their health plan.     Follow Up Plan:  Additional outreach attempts will be made to offer the patient care coordination information and services.   Encounter Outcome:  No Answer   Care Coordination Interventions:  No, not indicated    Dudley Major RN, BSN,CCM, CDE Care Management Coordinator [facility] Care Management [phone]

## 2022-09-28 DIAGNOSIS — F430 Acute stress reaction: Secondary | ICD-10-CM | POA: Diagnosis not present

## 2022-09-28 DIAGNOSIS — R972 Elevated prostate specific antigen [PSA]: Secondary | ICD-10-CM | POA: Diagnosis not present

## 2022-09-28 DIAGNOSIS — R030 Elevated blood-pressure reading, without diagnosis of hypertension: Secondary | ICD-10-CM | POA: Diagnosis not present

## 2022-09-28 DIAGNOSIS — E1169 Type 2 diabetes mellitus with other specified complication: Secondary | ICD-10-CM | POA: Diagnosis not present

## 2022-09-28 DIAGNOSIS — E119 Type 2 diabetes mellitus without complications: Secondary | ICD-10-CM | POA: Diagnosis not present

## 2022-10-02 ENCOUNTER — Telehealth: Payer: Self-pay

## 2022-10-02 DIAGNOSIS — E782 Mixed hyperlipidemia: Secondary | ICD-10-CM | POA: Insufficient documentation

## 2022-10-02 DIAGNOSIS — E119 Type 2 diabetes mellitus without complications: Secondary | ICD-10-CM | POA: Insufficient documentation

## 2022-10-02 NOTE — Patient Instructions (Signed)
Visit Information  Thank you for taking time to visit with me today. Please don't hesitate to contact me if I can be of assistance to you.   Following are the goals we discussed today:   Goals Addressed             This Visit's Progress    Care Coordination Activities- self management of type 2 diabetes       Care Coordination Interventions: Provided education to patient about basic DM disease process Reviewed medications with patient and discussed importance of medication adherence Discussed plans with patient for ongoing care management follow up and provided patient with direct contact information for care management team Advised patient, providing education and rationale, to check cbg daily and record, calling provider for findings outside established parameters Assessed social determinant of health barriers Discussed speaking with provider about use of GLP-1 receptor agonist  for better A1C control and weight loss and use of savings cards to help with cost  Discussed  to follow a low carbohydrate, low salt diet, watch portion sizes and avoid sugar sweetened drinks          Our next appointment is by telephone on 10/30/22 at 9:30 AM  Please call the care guide team at [phone] if you need to cancel or reschedule your appointment.   If you are experiencing a Mental Health or [place] or need someone to talk to, please call the Suicide and Crisis Lifeline: 988 call the [place] National Suicide Prevention Lifeline: 787-193-4789 or TTY: (613) 011-2888 TTY (681)197-9242) to talk to a trained counselor call 1-800-273-TALK (toll free, 24 hour hotline) call 911   The patient verbalized understanding of instructions, educational materials, and care plan provided today and DECLINED offer to receive copy of patient instructions, educational materials, and care plan.   Telephone follow up appointment with care management team member scheduled for: 10/30/22  Peter Garter RN, Jackquline [place], [facility] [phone]

## 2022-10-02 NOTE — Patient Outreach (Signed)
  Care Coordination   Initial Visit Note   10/02/2022 Name: Bryan Wood. MRN: [id number] DOB: 1965-11-03  Bryan Wood. is a 57 y.o. year old male who sees Lawerance Cruel, MD for primary care. I spoke with  Bryan Wood. by phone today.  What matters to the patients health and wellness today?  I would like to get my diabetes under better control. States his A1C was better when he lost 20 lbs but he has regained some of the weight.  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      Goals Addressed             This Visit's Progress    Care Coordination Activities- self management of type 2 diabetes       Care Coordination Interventions: Provided education to patient about basic DM disease process Reviewed medications with patient and discussed importance of medication adherence Discussed plans with patient for ongoing care management follow up and provided patient with direct contact information for care management team Advised patient, providing education and rationale, to check cbg daily and record, calling provider for findings outside established parameters Assessed social determinant of health barriers Discussed speaking with provider about use of GLP-1 receptor agonist  for better A1C control and weight loss and use of savings cards to help with cost           SDOH assessments and interventions completed:  Yes  SDOH Interventions Today    Flowsheet Row Most Recent Value  SDOH Interventions   Food Insecurity Interventions Intervention Not Indicated  Housing Interventions Intervention Not Indicated  Transportation Interventions Intervention Not Indicated        Care Coordination Interventions:  Yes, provided   Follow up plan: Follow up call scheduled for 10/30/22    Encounter Outcome:  Pt. Visit Completed  Peter Garter RN, [facility], [place] Management [phone]

## 2022-10-16 DIAGNOSIS — Z8042 Family history of malignant neoplasm of prostate: Secondary | ICD-10-CM | POA: Diagnosis not present

## 2022-10-16 DIAGNOSIS — R972 Elevated prostate specific antigen [PSA]: Secondary | ICD-10-CM | POA: Diagnosis not present

## 2022-10-30 ENCOUNTER — Ambulatory Visit: Payer: Self-pay

## 2022-10-30 NOTE — Patient Outreach (Signed)
  Care Coordination   10/30/2022 Name: Lemont Duel. MRN: [id number] DOB: May 18, 1966   Care Coordination Outreach Attempts:  An unsuccessful telephone outreach was attempted for a scheduled appointment today.  Follow Up Plan:  Additional outreach attempts will be made to offer the patient care coordination information and services.   Encounter Outcome:  No Answer   Care Coordination Interventions:  No, not indicated    Peter Garter RN, BSN,CCM, CDE Care Management Coordinator [facility] [phone]

## 2022-11-13 ENCOUNTER — Telehealth: Payer: Self-pay | Admitting: *Deleted

## 2022-11-13 NOTE — Progress Notes (Unsigned)
  Care Coordination Note  11/13/2022 Name: Bryan Wood. MRN: [id number] DOB: 1966-03-15  Bryan Burrow. is a 57 y.o. year old male who is a primary care patient of Lawerance Cruel, MD and is actively engaged with the care management team. I reached out to Bryan Burrow. by phone today to assist with re-scheduling a follow up visit with the RN Case Manager  Follow up plan: Unsuccessful telephone outreach attempt made. A HIPAA compliant phone message was left for the patient providing contact information and requesting a return call.   [place]  Direct Dial: [phone]

## 2022-11-14 NOTE — Progress Notes (Signed)
  Care Coordination Note  11/14/2022 Name: [name]. MRN: [id number] DOB: Jan 11, 1966  Bryan Wood. is a 57 y.o. year old male who is a primary care patient of Lawerance Cruel, MD and is actively engaged with the care management team. I reached out to Bryan Wood. by phone today to assist with re-scheduling a follow up visit with the RN Case Manager  Follow up plan: Unsuccessful telephone outreach attempt made. A HIPAA compliant phone message was left for the patient providing contact information and requesting a return call. We have been unable to make contact with the patient for follow up.   [place]  Direct Dial: [phone]

## 2022-12-03 DIAGNOSIS — R972 Elevated prostate specific antigen [PSA]: Secondary | ICD-10-CM | POA: Diagnosis not present

## 2023-08-22 DIAGNOSIS — E782 Mixed hyperlipidemia: Secondary | ICD-10-CM | POA: Diagnosis not present

## 2023-08-22 DIAGNOSIS — Z125 Encounter for screening for malignant neoplasm of prostate: Secondary | ICD-10-CM | POA: Diagnosis not present

## 2023-08-22 DIAGNOSIS — E559 Vitamin D deficiency, unspecified: Secondary | ICD-10-CM | POA: Diagnosis not present

## 2023-08-22 DIAGNOSIS — E119 Type 2 diabetes mellitus without complications: Secondary | ICD-10-CM | POA: Diagnosis not present

## 2023-08-22 DIAGNOSIS — Z0000 Encounter for general adult medical examination without abnormal findings: Secondary | ICD-10-CM | POA: Diagnosis not present

## 2023-08-26 DIAGNOSIS — E1169 Type 2 diabetes mellitus with other specified complication: Secondary | ICD-10-CM | POA: Diagnosis not present

## 2023-08-26 DIAGNOSIS — E782 Mixed hyperlipidemia: Secondary | ICD-10-CM | POA: Diagnosis not present

## 2023-08-26 DIAGNOSIS — E559 Vitamin D deficiency, unspecified: Secondary | ICD-10-CM | POA: Diagnosis not present

## 2023-08-26 DIAGNOSIS — Z0000 Encounter for general adult medical examination without abnormal findings: Secondary | ICD-10-CM | POA: Diagnosis not present

## 2023-08-26 DIAGNOSIS — F430 Acute stress reaction: Secondary | ICD-10-CM | POA: Diagnosis not present

## 2024-01-14 DIAGNOSIS — H6123 Impacted cerumen, bilateral: Secondary | ICD-10-CM | POA: Diagnosis not present

## 2024-02-14 DIAGNOSIS — L609 Nail disorder, unspecified: Secondary | ICD-10-CM | POA: Diagnosis not present

## 2024-02-14 DIAGNOSIS — F430 Acute stress reaction: Secondary | ICD-10-CM | POA: Diagnosis not present

## 2024-02-14 DIAGNOSIS — E1169 Type 2 diabetes mellitus with other specified complication: Secondary | ICD-10-CM | POA: Diagnosis not present

## 2024-02-14 DIAGNOSIS — Z6827 Body mass index (BMI) 27.0-27.9, adult: Secondary | ICD-10-CM | POA: Diagnosis not present

## 2024-04-03 DIAGNOSIS — M2012 Hallux valgus (acquired), left foot: Secondary | ICD-10-CM | POA: Diagnosis not present

## 2024-04-03 DIAGNOSIS — M79672 Pain in left foot: Secondary | ICD-10-CM | POA: Diagnosis not present

## 2024-04-17 ENCOUNTER — Ambulatory Visit (INDEPENDENT_AMBULATORY_CARE_PROVIDER_SITE_OTHER): Admitting: Podiatry

## 2024-04-17 ENCOUNTER — Ambulatory Visit (INDEPENDENT_AMBULATORY_CARE_PROVIDER_SITE_OTHER)

## 2024-04-17 ENCOUNTER — Encounter: Payer: Self-pay | Admitting: Podiatry

## 2024-04-17 DIAGNOSIS — S9032XA Contusion of left foot, initial encounter: Secondary | ICD-10-CM | POA: Diagnosis not present

## 2024-04-17 DIAGNOSIS — M2141 Flat foot [pes planus] (acquired), right foot: Secondary | ICD-10-CM | POA: Diagnosis not present

## 2024-04-17 DIAGNOSIS — E119 Type 2 diabetes mellitus without complications: Secondary | ICD-10-CM | POA: Diagnosis not present

## 2024-04-17 DIAGNOSIS — M2142 Flat foot [pes planus] (acquired), left foot: Secondary | ICD-10-CM | POA: Diagnosis not present

## 2024-04-17 DIAGNOSIS — M205X2 Other deformities of toe(s) (acquired), left foot: Secondary | ICD-10-CM

## 2024-04-17 MED ORDER — TRIAMCINOLONE ACETONIDE 10 MG/ML IJ SUSP
5.0000 mg | Freq: Once | INTRAMUSCULAR | Status: AC
  Administered 2024-04-17: 5 mg

## 2024-04-17 NOTE — Progress Notes (Unsigned)
 Chief Complaint  Patient presents with   Foot Pain    L great toe and meta area pain 7.  No redness. Swelling. First notice 10 months ago and was just in toe. Has tried spacers, massage, wider toebox shoes. Diabetic.  No anti coag    HPI: 58 y.o. male presents today with complaint of left great toe pain and stiffness.  He is concerned about potential bunion.  He has tried using toe spacers, massage, some shoe modifications including wider toebox shoes.  Area has been painful for at least the last 10 months.  He is diabetic.  Per chart review, A1c in June was 7.9.  History reviewed. No pertinent past medical history.  History reviewed. No pertinent surgical history.  Allergies  Allergen Reactions   Meloxicam Rash    ROS denies any nausea, vomiting, fever, chills, chest pain or shortness of breath   Physical Exam: There were no vitals filed for this visit.  General: The patient is alert and oriented x3 in no acute distress.  Dermatology: Skin is warm, dry and supple bilateral lower extremities. Interspaces are clear of maceration and debris.    Vascular: Palpable pedal pulses bilaterally. Capillary refill within normal limits.  No appreciable edema.  No erythema or calor.  Neurological: Light touch sensation grossly intact bilateral feet.   Musculoskeletal Exam: Muscle strength 5/5 for all major muscle groups.  Left first MPJ decreased range of motion with limited dorsiflexion available, still decent plantarflexion range of motion present.  There is some palpable spurring with some medial eminence as well that is tender on palpation.  There is some crepitus on first MPJ range of motion.  Pes planus foot type appreciated bilaterally with increased forefoot abduction with weightbearing and gait.  Arch collapse present.  Radiographic Exam: Left foot 3 views weightbearing 04/17/2024 Normal osseous mineralization.  First MPJ joint space narrowing present.  Very mild increase in the  IM angle.  Short first metatarsal present.  Pes planus foot type.  Assessment/Plan of Care: 1. Acquired hallux limitus of left foot   2. Bilateral pes planus   3. Diabetes mellitus without complication (HCC)      Meds ordered this encounter  Medications   triamcinolone acetonide (KENALOG) 10 MG/ML injection 5 mg   FOR HOME USE ONLY DME CUSTOM ORTHOTICS  Discussed clinical findings with patient today.  Radiographs reviewed with patient  Findings notable for hallux limitus left foot and bilateral pes planus.  We discussed conservative and surgical modalities of managing the pain associated with the hallux limitus.  Discussed NSAIDs, shoe modification, custom orthotics, activity modification, corticosteroid injection.  Did briefly discuss surgical intervention as well.  Patient wants to proceed with conservative care at this time.  Recommend that patient take over-the-counter naproxen for the next 2 to 3 weeks.  For the patient's concomitant flatfoot and the left first MPJ hallux limitus Prescription sent in for custom orthotics with left foot Morton's extension.  These will alter the patient's biomechanics and decrease loading and motion on the left first toe.  Verbal consent obtained to minister corticosteroid injection to the left first MPJ.  Skin was prepped with Betadine.  Injected 0.5 cc of 0.5% Marcaine plain mixed with 5 mg Kenalog and 0.5 cc of dexamethasone.  Band-Aid applied.  Patient tolerated this well.   Follow-up as needed for left foot hallux limitus.  Leida Luton L. Lamount MAUL, AACFAS [facility]     [address]  [address], [place] [address]                Office [phone]  Fax [phone]

## 2024-04-17 NOTE — Patient Instructions (Signed)
Hallux Limitus Hallux limitus is a condition involving pain and a loss of motion of the first (big) toe. The pain gets worse with lifting up (extension) of the toe. This is usually due to arthritic bony bumps (spurring) of the joint at the base of the big toe.  SYMPTOMS   Pain, with lifting up of the toe.  Tenderness over the joint where the big toe meets the foot.  Redness, swelling, and warmth over the top of the base of the big toe (sometimes).  Foot pain, stiffness, and limping. CAUSES  Hallux limitus is caused by arthritis of the joint where the big toe meets the foot. The arthritis creates a bone spur that pinches the soft tissues when the toe is extended. RISK INCREASES WITH:  Tight shoes with a narrow toe box.  Family history of foot problems.  Gout and rheumatoid and psoriatic arthritis.  History of previous toe injury, including "turf toe."  Long first toe, flat feet, and other big toe bony bumps.  Arthritis of the big toe. PREVENTION   Wear wide-toed shoes that fit well.  Tape the big toe to reduce motion and to prevent pinching of the tissues between the bone.  Maintain physical fitness:  Foot and ankle flexibility.  Muscle strength and endurance. PROGNOSIS  This condition can usually be managed with proper treatment. However, surgery is typically required to prevent the problem from recurring.  RELATED COMPLICATIONS  Injury to other areas of the foot or ankle, caused by abnormal walking in an attempt to avoid the pain felt when walking normally. TREATMENT Treatment first involves stopping the activities that aggravate your symptoms. Ice and medicine can be used to reduce the pain and inflammation. Modifications to shoes may help reduce pain, including wearing stiff-soled shoes, shoes with a wide toe box, inserting a padded donut to relieve pressure on top of the joint, or wearing an arch support. Corticosteroid injections may be given to reduce inflammation. If  nonsurgical treatment is unsuccessful, surgery may be needed. Surgical options include removing the arthritic bony spur, cutting a bone in the foot to change the arc of motion (allowing the toe to extend more), or fusion of the joint (eliminating all motion in the joint at the base of the big toe).  MEDICATION   If pain medicine is needed, nonsteroidal anti-inflammatory medicines (aspirin and ibuprofen), or other minor pain relievers (acetaminophen), are often advised.  Do not take pain medicine for 7 days before surgery.  Prescription pain relievers are usually prescribed only after surgery. Use only as directed and only as much as you need.  Ointments for arthritis, applied to the skin, may give some relief.  Injections of corticosteroids may be given to reduce inflammation. HEAT AND COLD  Cold treatment (icing) relieves pain and reduces inflammation. Cold treatment should be applied for 10 to 15 minutes every 2 to 3 hours, and immediately after activity that aggravates your symptoms. Use ice packs or an ice massage.  Heat treatment may be used before performing the stretching and strengthening activities prescribed by your caregiver, physical therapist, or athletic trainer. Use a heat pack or a warm water soak. SEEK MEDICAL CARE IF:   Symptoms get worse or do not improve in 2 weeks, despite treatment.  After surgery you develop fever, increasing pain, redness, swelling, drainage of fluids, bleeding, or increasing warmth.  New, unexplained symptoms develop.    

## 2024-05-08 ENCOUNTER — Ambulatory Visit: Admitting: Podiatry

## 2024-05-15 ENCOUNTER — Ambulatory Visit: Admitting: Podiatry

## 2024-05-29 ENCOUNTER — Other Ambulatory Visit

## 2024-06-07 DIAGNOSIS — Z23 Encounter for immunization: Secondary | ICD-10-CM | POA: Diagnosis not present

## 2024-06-07 DIAGNOSIS — S61250A Open bite of right index finger without damage to nail, initial encounter: Secondary | ICD-10-CM | POA: Diagnosis not present

## 2024-06-22 DIAGNOSIS — M25519 Pain in unspecified shoulder: Secondary | ICD-10-CM | POA: Diagnosis not present

## 2024-06-22 DIAGNOSIS — E1169 Type 2 diabetes mellitus with other specified complication: Secondary | ICD-10-CM | POA: Diagnosis not present

## 2024-06-22 DIAGNOSIS — Z09 Encounter for follow-up examination after completed treatment for conditions other than malignant neoplasm: Secondary | ICD-10-CM | POA: Diagnosis not present

## 2024-06-22 DIAGNOSIS — Z6828 Body mass index (BMI) 28.0-28.9, adult: Secondary | ICD-10-CM | POA: Diagnosis not present

## 2024-06-30 DIAGNOSIS — M7522 Bicipital tendinitis, left shoulder: Secondary | ICD-10-CM | POA: Diagnosis not present

## 2024-06-30 DIAGNOSIS — M7582 Other shoulder lesions, left shoulder: Secondary | ICD-10-CM | POA: Diagnosis not present

## 2024-07-02 DIAGNOSIS — M7522 Bicipital tendinitis, left shoulder: Secondary | ICD-10-CM | POA: Diagnosis not present

## 2024-07-02 DIAGNOSIS — M25612 Stiffness of left shoulder, not elsewhere classified: Secondary | ICD-10-CM | POA: Diagnosis not present

## 2024-07-02 DIAGNOSIS — M6281 Muscle weakness (generalized): Secondary | ICD-10-CM | POA: Diagnosis not present

## 2024-07-23 DIAGNOSIS — M25512 Pain in left shoulder: Secondary | ICD-10-CM | POA: Diagnosis not present

## 2024-08-07 DIAGNOSIS — M7502 Adhesive capsulitis of left shoulder: Secondary | ICD-10-CM | POA: Diagnosis not present

## 2024-08-07 DIAGNOSIS — M7522 Bicipital tendinitis, left shoulder: Secondary | ICD-10-CM | POA: Diagnosis not present

## 2024-08-21 ENCOUNTER — Emergency Department (HOSPITAL_COMMUNITY)
Admission: EM | Admit: 2024-08-21 | Discharge: 2024-08-21 | Disposition: A | Discharge status: Home / Self Care | Attending: Emergency Medicine | Admitting: Emergency Medicine

## 2024-08-21 ENCOUNTER — Other Ambulatory Visit: Payer: Self-pay

## 2024-08-21 ENCOUNTER — Encounter (HOSPITAL_COMMUNITY): Payer: Self-pay

## 2024-08-21 DIAGNOSIS — E1165 Type 2 diabetes mellitus with hyperglycemia: Secondary | ICD-10-CM | POA: Insufficient documentation

## 2024-08-21 DIAGNOSIS — R1110 Vomiting, unspecified: Secondary | ICD-10-CM | POA: Diagnosis not present

## 2024-08-21 DIAGNOSIS — Z7984 Long term (current) use of oral hypoglycemic drugs: Secondary | ICD-10-CM | POA: Diagnosis not present

## 2024-08-21 DIAGNOSIS — R1084 Generalized abdominal pain: Secondary | ICD-10-CM | POA: Insufficient documentation

## 2024-08-21 DIAGNOSIS — E8889 Other specified metabolic disorders: Secondary | ICD-10-CM | POA: Diagnosis not present

## 2024-08-21 DIAGNOSIS — R112 Nausea with vomiting, unspecified: Secondary | ICD-10-CM

## 2024-08-21 DIAGNOSIS — D72829 Elevated white blood cell count, unspecified: Secondary | ICD-10-CM | POA: Insufficient documentation

## 2024-08-21 LAB — COMPREHENSIVE METABOLIC PANEL WITH GFR
ALT: 28 U/L (ref 0–44)
AST: 24 U/L (ref 15–41)
Albumin: 4.7 g/dL (ref 3.5–5.0)
Alkaline Phosphatase: 83 U/L (ref 38–126)
Anion gap: 16 — ABNORMAL HIGH (ref 5–15)
BUN: 19 mg/dL (ref 6–20)
CO2: 22 mmol/L (ref 22–32)
Calcium: 9.7 mg/dL (ref 8.9–10.3)
Chloride: 100 mmol/L (ref 98–111)
Creatinine, Ser: 0.96 mg/dL (ref 0.61–1.24)
GFR, Estimated: >60 mL/min
Glucose, Bld: 300 mg/dL — ABNORMAL HIGH (ref 70–99)
Potassium: 4.5 mmol/L (ref 3.5–5.1)
Sodium: 138 mmol/L (ref 135–145)
Total Bilirubin: 0.3 mg/dL (ref 0.0–1.2)
Total Protein: 7.7 g/dL (ref 6.5–8.1)

## 2024-08-21 LAB — URINALYSIS, ROUTINE W REFLEX MICROSCOPIC
Bacteria, UA: NONE SEEN
Bilirubin Urine: NEGATIVE
Glucose, UA: >=500 mg/dL — AB
Hgb urine dipstick: NEGATIVE
Ketones, ur: 20 mg/dL — AB
Leukocytes,Ua: NEGATIVE
Nitrite: NEGATIVE
Protein, ur: NEGATIVE mg/dL
Specific Gravity, Urine: 1.033 — ABNORMAL HIGH (ref 1.005–1.030)
pH: 5 (ref 5.0–8.0)

## 2024-08-21 LAB — CBC
HCT: 49 % (ref 39.0–52.0)
Hemoglobin: 16.5 g/dL (ref 13.0–17.0)
MCH: 31.3 pg (ref 26.0–34.0)
MCHC: 33.7 g/dL (ref 30.0–36.0)
MCV: 92.8 fL (ref 80.0–100.0)
Platelets: 164 K/uL (ref 150–400)
RBC: 5.28 MIL/uL (ref 4.22–5.81)
RDW: 13 % (ref 11.5–15.5)
WBC: 15.4 K/uL — ABNORMAL HIGH (ref 4.0–10.5)
nRBC: 0 % (ref 0.0–0.2)

## 2024-08-21 LAB — BASIC METABOLIC PANEL WITH GFR
Anion gap: 14 (ref 5–15)
BUN: 18 mg/dL (ref 6–20)
CO2: 24 mmol/L (ref 22–32)
Calcium: 9.8 mg/dL (ref 8.9–10.3)
Chloride: 101 mmol/L (ref 98–111)
Creatinine, Ser: 0.94 mg/dL (ref 0.61–1.24)
GFR, Estimated: >60 mL/min
Glucose, Bld: 222 mg/dL — ABNORMAL HIGH (ref 70–99)
Potassium: 4.6 mmol/L (ref 3.5–5.1)
Sodium: 139 mmol/L (ref 135–145)

## 2024-08-21 LAB — LIPASE, BLOOD: Lipase: 25 U/L (ref 11–51)

## 2024-08-21 LAB — CBG MONITORING, ED: Glucose-Capillary: 295 mg/dL — ABNORMAL HIGH (ref 70–99)

## 2024-08-21 MED ORDER — ONDANSETRON 8 MG PO TBDP: 8.0000 mg | ORAL_TABLET | Freq: Three times a day (TID) | ORAL | 0 refills | Status: AC | PRN

## 2024-08-21 MED ORDER — LACTATED RINGERS IV BOLUS
1000.0000 mL | Freq: Once | INTRAVENOUS | Status: AC
  Administered 2024-08-21: 1000 mL via INTRAVENOUS

## 2024-08-21 MED ORDER — METOCLOPRAMIDE HCL 5 MG/ML IJ SOLN
10.0000 mg | Freq: Once | INTRAMUSCULAR | Status: AC
  Administered 2024-08-21: 10 mg via INTRAVENOUS
  Filled 2024-08-21: qty 2

## 2024-08-21 MED ORDER — LACTATED RINGERS IV SOLN: INTRAVENOUS | Status: DC

## 2024-08-21 NOTE — ED Provider Notes (Signed)
 " [place] EMERGENCY DEPARTMENT AT [facility] Provider Note   CSN: [id number] Arrival date & time: 08/21/24  1111     Patient presents with: Emesis   Bryan Wood. is a 58 y.o. male.   58 year old male presents with emesis after taking expired morphine yesterday.  Patient has history of chronic left shoulder pain and has been using ibuprofen prescribed for this.  States has had diffuse abdominal cramping without fever or chills.  Notes that the symptoms started 6 hours after he took the medication.  States his emesis been nonbilious or bloody.  No antibiotics used for this prior to arrival       Prior to Admission medications  Medication Sig Start Date End Date Taking? Authorizing Provider  acetaminophen  (TYLENOL ) 500 MG tablet Take 500-1,000 mg by mouth every 6 (six) hours as needed for mild pain or headache.    [provider]  bisacodyl (DULCOLAX) 5 MG EC tablet Take 5 mg by mouth daily as needed for moderate constipation.    [provider]  HYDROcodone -acetaminophen  (NORCO/VICODIN) 5-325 MG per tablet Take 1 tablet by mouth every 6 (six) hours as needed for moderate pain.    [provider]  HYDROcodone -acetaminophen  (NORCO/VICODIN) 5-325 MG per tablet Take 1 tablet by mouth every 4 (four) hours as needed. 10/12/13   Devora Perkins, PA-C  metFORMIN (GLUCOPHAGE) 500 MG tablet Take by mouth.    [provider]  ondansetron  (ZOFRAN ) 4 MG tablet Take 1 tablet (4 mg total) by mouth every 6 (six) hours. 10/12/13   Devora Perkins, PA-C  pravastatin (PRAVACHOL) 40 MG tablet Take by mouth. 09/29/22   [provider]  tamsulosin  (FLOMAX ) 0.4 MG CAPS capsule Take 1 capsule (0.4 mg total) by mouth daily. 10/12/13   Devora Perkins, PA-C    Allergies: Meloxicam    Review of Systems  All other systems reviewed and are negative.   Updated Vital Signs BP (!) 181/90   Pulse 74   Temp (!) 97.5 F (36.4 C) (Oral)   Resp 18   Ht 1.854 m (6'  1)   Wt 93.9 kg   SpO2 100%   BMI 27.31 kg/m   Physical Exam Vitals and nursing note reviewed.  Constitutional:      General: He is not in acute distress.    Appearance: Normal appearance. He is well-developed. He is not toxic-appearing.  HENT:     Head: Normocephalic and atraumatic.  Eyes:     General: Lids are normal.     Conjunctiva/sclera: Conjunctivae normal.     Pupils: Pupils are equal, round, and reactive to light.  Neck:     Thyroid: No thyroid mass.     Trachea: No tracheal deviation.  Cardiovascular:     Rate and Rhythm: Normal rate and regular rhythm.     Heart sounds: Normal heart sounds. No murmur heard.    No gallop.  Pulmonary:     Effort: Pulmonary effort is normal. No respiratory distress.     Breath sounds: Normal breath sounds. No stridor. No decreased breath sounds, wheezing, rhonchi or rales.  Abdominal:     General: There is no distension.     Palpations: Abdomen is soft.     Tenderness: There is no abdominal tenderness. There is no rebound.  Musculoskeletal:        General: No tenderness. Normal range of motion.     Cervical back: Normal range of motion and neck supple.  Skin:  General: Skin is warm and dry.     Findings: No abrasion or rash.  Neurological:     Mental Status: He is alert and oriented to person, place, and time. Mental status is at baseline.     GCS: GCS eye subscore is 4. GCS verbal subscore is 5. GCS motor subscore is 6.     Cranial Nerves: No cranial nerve deficit.     Sensory: No sensory deficit.     Motor: Motor function is intact.  Psychiatric:        Attention and Perception: Attention normal.        Speech: Speech normal.        Behavior: Behavior normal.     (all labs ordered are listed, but only abnormal results are displayed) Labs Reviewed  URINALYSIS, ROUTINE W REFLEX MICROSCOPIC - Abnormal; Notable for the following components:      Result Value   Specific Gravity, Urine 1.033 (*)    Glucose, UA >=500 (*)     Ketones, ur 20 (*)    All other components within normal limits  CBG MONITORING, ED - Abnormal; Notable for the following components:   Glucose-Capillary 295 (*)    All other components within normal limits  LIPASE, BLOOD  COMPREHENSIVE METABOLIC PANEL WITH GFR  CBC    EKG: None  Radiology: No results found.   Procedures   Medications Ordered in the ED  lactated ringers bolus 1,000 mL (has no administration in time range)  lactated ringers infusion (has no administration in time range)  metoCLOPramide (REGLAN) injection 10 mg (has no administration in time range)                                    Medical Decision Making Amount and/or Complexity of Data Reviewed Labs: ordered.  Risk Prescription drug management.   Patient's labs show increased blood glucose.  Patient is a type II diabetic and does not follow a diabetic diet.  His anion gap is slightly elevated at 16.  Urinalysis does show mild ketosis at 20.  Does have elevated white count of 15,000 however he has no abdominal pain or reexam at this time.  Do not feel that he needs to have any intra-abdominal imaging.  He has not had any emesis here after fluids and antiemetics.  Given 2 L of lactated Ringer's here.  Will recheck be met to make sure he close this is And likely discharge home     Final diagnoses:  None    ED Discharge Orders     None          Dasie Faden, MD 08/21/24 1422  "

## 2024-08-21 NOTE — ED Notes (Addendum)
 Patient reports he drank expired oral morphine around 0100 for left shoulder pain, pain subsided and patient felt better but then woke up around 0700 vomiting x 3. Patient able to tolerate PO fluids. HX of diabetes. Patient is alert and oriented x 4. Airway patent, respirations even and unlabored. Skin normal, warm and dry. Patient ambulatory to the bathroom without difficulty.

## 2024-08-21 NOTE — ED Notes (Signed)
 Patient alert and oriented x 4. Airway patent, respirations even and unlabored. Skin normal, warm and dry. Discharge instructions discussed with patient and patient's family. Patient has no questions at this time. Patient has safe ride home.

## 2024-08-21 NOTE — ED Triage Notes (Addendum)
 Patient BIB [facility] from home. Said at [facility] today he drank some morphine that was expired from 2022. Has been vomiting since. No diarrhea.

## 2024-08-25 DIAGNOSIS — E1169 Type 2 diabetes mellitus with other specified complication: Secondary | ICD-10-CM | POA: Diagnosis not present

## 2024-08-25 DIAGNOSIS — R972 Elevated prostate specific antigen [PSA]: Secondary | ICD-10-CM | POA: Diagnosis not present

## 2024-08-25 DIAGNOSIS — Z79899 Other long term (current) drug therapy: Secondary | ICD-10-CM | POA: Diagnosis not present

## 2024-08-25 DIAGNOSIS — E782 Mixed hyperlipidemia: Secondary | ICD-10-CM | POA: Diagnosis not present

## 2024-08-25 DIAGNOSIS — E559 Vitamin D deficiency, unspecified: Secondary | ICD-10-CM | POA: Diagnosis not present

## 2024-09-01 DIAGNOSIS — E1169 Type 2 diabetes mellitus with other specified complication: Secondary | ICD-10-CM | POA: Diagnosis not present

## 2024-09-01 DIAGNOSIS — E782 Mixed hyperlipidemia: Secondary | ICD-10-CM | POA: Diagnosis not present

## 2024-09-01 DIAGNOSIS — F430 Acute stress reaction: Secondary | ICD-10-CM | POA: Diagnosis not present

## 2024-09-01 DIAGNOSIS — Z0000 Encounter for general adult medical examination without abnormal findings: Secondary | ICD-10-CM | POA: Diagnosis not present

## 2024-09-01 DIAGNOSIS — R972 Elevated prostate specific antigen [PSA]: Secondary | ICD-10-CM | POA: Diagnosis not present
